# Patient Record
Sex: Female | Born: 1970 | Race: White | Hispanic: No | Smoking: Current every day smoker
Health system: Southern US, Community
[De-identification: ages and names within clinical notes are randomized; demographics above are authoritative.]

## PROBLEM LIST (undated history)

## (undated) DIAGNOSIS — K2210 Ulcer of esophagus without bleeding: Secondary | ICD-10-CM

## (undated) DIAGNOSIS — F32A Depression, unspecified: Secondary | ICD-10-CM

## (undated) DIAGNOSIS — F419 Anxiety disorder, unspecified: Secondary | ICD-10-CM

## (undated) DIAGNOSIS — E785 Hyperlipidemia, unspecified: Secondary | ICD-10-CM

## (undated) DIAGNOSIS — J449 Chronic obstructive pulmonary disease, unspecified: Secondary | ICD-10-CM

## (undated) DIAGNOSIS — G709 Myoneural disorder, unspecified: Secondary | ICD-10-CM

## (undated) DIAGNOSIS — K219 Gastro-esophageal reflux disease without esophagitis: Secondary | ICD-10-CM

## (undated) DIAGNOSIS — F329 Major depressive disorder, single episode, unspecified: Secondary | ICD-10-CM

## (undated) HISTORY — DX: Chronic obstructive pulmonary disease, unspecified: J44.9

## (undated) HISTORY — DX: Ulcer of esophagus without bleeding: K22.10

## (undated) HISTORY — PX: OTHER SURGICAL HISTORY: SHX169

## (undated) HISTORY — DX: Hyperlipidemia, unspecified: E78.5

## (undated) HISTORY — DX: Anxiety disorder, unspecified: F41.9

## (undated) HISTORY — DX: Major depressive disorder, single episode, unspecified: F32.9

## (undated) HISTORY — DX: Myoneural disorder, unspecified: G70.9

## (undated) HISTORY — PX: BREAST SURGERY: SHX581

## (undated) HISTORY — DX: Gastro-esophageal reflux disease without esophagitis: K21.9

## (undated) HISTORY — DX: Depression, unspecified: F32.A

---

## 2004-07-14 ENCOUNTER — Emergency Department: Payer: Self-pay | Admitting: Emergency Medicine

## 2004-07-15 ENCOUNTER — Other Ambulatory Visit: Payer: Self-pay

## 2004-07-19 ENCOUNTER — Ambulatory Visit: Payer: Self-pay | Admitting: *Deleted

## 2004-08-11 ENCOUNTER — Other Ambulatory Visit: Payer: Self-pay

## 2004-08-11 ENCOUNTER — Emergency Department: Payer: Self-pay | Admitting: Emergency Medicine

## 2005-05-22 ENCOUNTER — Emergency Department: Payer: Self-pay | Admitting: Emergency Medicine

## 2005-06-16 ENCOUNTER — Emergency Department: Payer: Self-pay | Admitting: Emergency Medicine

## 2005-09-25 HISTORY — PX: BREAST BIOPSY: SHX20

## 2005-11-01 ENCOUNTER — Emergency Department: Payer: Self-pay | Admitting: Emergency Medicine

## 2005-11-01 ENCOUNTER — Other Ambulatory Visit: Payer: Self-pay

## 2006-04-23 ENCOUNTER — Inpatient Hospital Stay: Payer: Self-pay | Admitting: Obstetrics & Gynecology

## 2006-09-28 ENCOUNTER — Ambulatory Visit: Payer: Self-pay

## 2006-10-04 ENCOUNTER — Ambulatory Visit: Payer: Self-pay

## 2007-02-09 ENCOUNTER — Emergency Department: Payer: Self-pay | Admitting: Emergency Medicine

## 2007-04-13 ENCOUNTER — Ambulatory Visit: Payer: Self-pay | Admitting: Neurology

## 2007-07-15 ENCOUNTER — Other Ambulatory Visit: Payer: Self-pay

## 2007-07-15 ENCOUNTER — Emergency Department: Payer: Self-pay | Admitting: Emergency Medicine

## 2007-12-04 ENCOUNTER — Encounter: Payer: Self-pay | Admitting: Family Medicine

## 2007-12-25 ENCOUNTER — Encounter: Payer: Self-pay | Admitting: Family Medicine

## 2008-03-30 ENCOUNTER — Emergency Department: Payer: Self-pay | Admitting: Emergency Medicine

## 2008-03-30 ENCOUNTER — Other Ambulatory Visit: Payer: Self-pay

## 2009-03-05 ENCOUNTER — Ambulatory Visit: Payer: Self-pay | Admitting: Family Medicine

## 2015-09-09 DIAGNOSIS — E785 Hyperlipidemia, unspecified: Secondary | ICD-10-CM | POA: Insufficient documentation

## 2015-09-09 DIAGNOSIS — G8929 Other chronic pain: Secondary | ICD-10-CM | POA: Insufficient documentation

## 2015-09-09 DIAGNOSIS — J449 Chronic obstructive pulmonary disease, unspecified: Secondary | ICD-10-CM | POA: Insufficient documentation

## 2015-09-09 DIAGNOSIS — N6019 Diffuse cystic mastopathy of unspecified breast: Secondary | ICD-10-CM | POA: Insufficient documentation

## 2015-09-09 DIAGNOSIS — M5127 Other intervertebral disc displacement, lumbosacral region: Secondary | ICD-10-CM | POA: Insufficient documentation

## 2015-09-09 DIAGNOSIS — F419 Anxiety disorder, unspecified: Secondary | ICD-10-CM | POA: Insufficient documentation

## 2015-09-09 DIAGNOSIS — G43909 Migraine, unspecified, not intractable, without status migrainosus: Secondary | ICD-10-CM | POA: Insufficient documentation

## 2015-09-09 DIAGNOSIS — Z720 Tobacco use: Secondary | ICD-10-CM | POA: Insufficient documentation

## 2015-09-09 DIAGNOSIS — K219 Gastro-esophageal reflux disease without esophagitis: Secondary | ICD-10-CM | POA: Insufficient documentation

## 2015-09-09 DIAGNOSIS — F32A Depression, unspecified: Secondary | ICD-10-CM | POA: Insufficient documentation

## 2015-09-09 DIAGNOSIS — F3340 Major depressive disorder, recurrent, in remission, unspecified: Secondary | ICD-10-CM | POA: Insufficient documentation

## 2015-09-09 DIAGNOSIS — F411 Generalized anxiety disorder: Secondary | ICD-10-CM | POA: Insufficient documentation

## 2015-09-09 DIAGNOSIS — M25512 Pain in left shoulder: Secondary | ICD-10-CM

## 2016-06-19 ENCOUNTER — Encounter: Payer: Self-pay | Admitting: *Deleted

## 2016-06-19 ENCOUNTER — Encounter: Payer: Self-pay | Admitting: Family Medicine

## 2016-06-19 ENCOUNTER — Ambulatory Visit (INDEPENDENT_AMBULATORY_CARE_PROVIDER_SITE_OTHER): Payer: Medicaid Other | Admitting: Family Medicine

## 2016-06-19 VITALS — BP 128/76 | HR 60 | Temp 98.4°F | Resp 16 | Ht 64.0 in | Wt 141.6 lb

## 2016-06-19 DIAGNOSIS — Z114 Encounter for screening for human immunodeficiency virus [HIV]: Secondary | ICD-10-CM | POA: Diagnosis not present

## 2016-06-19 DIAGNOSIS — E785 Hyperlipidemia, unspecified: Secondary | ICD-10-CM | POA: Diagnosis not present

## 2016-06-19 DIAGNOSIS — Z131 Encounter for screening for diabetes mellitus: Secondary | ICD-10-CM

## 2016-06-19 DIAGNOSIS — M4726 Other spondylosis with radiculopathy, lumbar region: Secondary | ICD-10-CM | POA: Diagnosis not present

## 2016-06-19 DIAGNOSIS — M47812 Spondylosis without myelopathy or radiculopathy, cervical region: Secondary | ICD-10-CM | POA: Insufficient documentation

## 2016-06-19 DIAGNOSIS — F339 Major depressive disorder, recurrent, unspecified: Secondary | ICD-10-CM | POA: Diagnosis not present

## 2016-06-19 DIAGNOSIS — M47816 Spondylosis without myelopathy or radiculopathy, lumbar region: Secondary | ICD-10-CM | POA: Insufficient documentation

## 2016-06-19 DIAGNOSIS — K219 Gastro-esophageal reflux disease without esophagitis: Secondary | ICD-10-CM | POA: Diagnosis not present

## 2016-06-19 DIAGNOSIS — F419 Anxiety disorder, unspecified: Secondary | ICD-10-CM | POA: Diagnosis not present

## 2016-06-19 DIAGNOSIS — Z720 Tobacco use: Secondary | ICD-10-CM

## 2016-06-19 DIAGNOSIS — G8929 Other chronic pain: Secondary | ICD-10-CM | POA: Diagnosis not present

## 2016-06-19 DIAGNOSIS — N951 Menopausal and female climacteric states: Secondary | ICD-10-CM | POA: Diagnosis not present

## 2016-06-19 MED ORDER — RANITIDINE HCL 150 MG PO TABS: 150.0000 mg | ORAL_TABLET | Freq: Two times a day (BID) | ORAL | 5 refills | Status: DC

## 2016-06-19 MED ORDER — METAXALONE 800 MG PO TABS: 400.0000 mg | ORAL_TABLET | Freq: Two times a day (BID) | ORAL | 2 refills | Status: DC | PRN

## 2016-06-19 NOTE — Assessment & Plan Note (Signed)
Stable, chronic problem. Previousy on Zantac 150mg  x 2 BID, prior H Pylori. No known PUD.  Plan: 1. Reorder Zantac 150mg  BID, reduced dose, will follow trial if needs increased then consider double dose vs PPI

## 2016-06-19 NOTE — Assessment & Plan Note (Signed)
Stable irregular menstrual bleeding with regular period q 1 to 3 months over past >6 months. No excessive bleeding or other symptoms of menopause at this time.

## 2016-06-19 NOTE — Assessment & Plan Note (Signed)
Last lipids 12/2014, stable. Previously with brief course of Crestor and Fish Oil, off all meds now.  Plan: 1. Future fasting lipid panel, follow-up, calculate ASCVD risk

## 2016-06-19 NOTE — Assessment & Plan Note (Signed)
Likely underlying cause of chronic neck pain and stiffness, no recent imaging on chart. Previously treated with Neurontin, consider resuming this or alternative pain medications in future. - Referral to [facility] - Reorder Skelaxin - See other details under Chronic Pain

## 2016-06-19 NOTE — Assessment & Plan Note (Signed)
Currently stable, but some recent concerns with mother moving in over past several months. Chronic problem with depression and related anxiety, related to family stress (specifically living with and acting as primary caregiver for mother), previously treated with Paxil and Therapy, however no longer receiving these.  Plan: 1. Not interested in resuming any medical therapy at this time 2. Given self referral info for local therapist for counseling, suspect this additional support person may be most important for patient now, and future can consider resuming medications if needed

## 2016-06-19 NOTE — Assessment & Plan Note (Addendum)
Stable without recent change, chronic bilateral low back pain with R-sciatica, and neck pain, history with multi level spinal DJD C/L spine. No prior surgery. - Previously managed by Pain Management in [place] - Last MRI C spine 2008, other imaging not available in chart, awaiting records - Previously on Vicodin has been out for >6 months, no longer actively taking, most improved on Skelaxin muscle relaxant. Failed Flexeril, Meloxicam, Neurontin. - Checked [place] CSRS and no active rx available or recent history of any controlled subs fills  Plan: 1. Referral to [facility] 2. Discussion that I do not plan to rx chronic long-term opiates for her, and would start with muscle relaxant. Refilled Skelaxin 800mg  - take half to 1 tab BID PRN pain / spasms 3. Adviesd on Tylenol dosing, may take future Naproxen for up to 2 week at a time flares, cautious with GERD / H pylori history, avoid prolonged course 4. Defer imaging today, anticipate to be obtained through Pain Management screening process 5. Follow-up 1-3 months as needed, returns for physical and labs soon - Future follow-up consider med such as Cymbalta for depression and chronic pain

## 2016-06-19 NOTE — Assessment & Plan Note (Signed)
Likely underlying cause of chronic neck pain and stiffness, no recent imaging on chart. Last MRI C spine 2008 with minimal degenerative changes without significant canal or foraminal narrowing. - Referral to [facility] - Reorder Skelaxin - See other details under Chronic Pain

## 2016-06-19 NOTE — Assessment & Plan Note (Signed)
Active smoker, down to 0.5ppd Not ready to quit, offered smoking cessation counseling, will defer at this time.

## 2016-06-19 NOTE — Progress Notes (Signed)
Subjective:    Patient ID: Denise Estrada, female    DOB: Sep 27, 1970, 45 y.o.   MRN: [id number]  Denise Estrada is a 45 y.o. female presenting on 06/19/2016 for Establish Care; Gastroesophageal Reflux (refill zantac); Muscle Pain (neck/back pain); and pre-menopausal  Recently moved from [place] to [place].  HPI  Chronic Pain / Neck and Low Back with multi level DJD include C5-4, L5-S1 and history of nerve entrapment: - Previously followed by Neurology / Pain Management in [facility], states she has never had back or neck surgery, and was advised that "she did not need surgery" in past. Has been treated with variety of medications including neurontin, muscle relaxants, eventually reduced down to vicodin 10/325mg  PRN and Skelaxin only, has been off of opiate medicine for several months >6 now. Seems to be stable, as muscle relaxant was most beneficial. - Describes chronic pain in back as aching and some sharp pains up to 5/10 average, with some intermittent worsening, prolonged activity, sitting, standing, forward flexion. Improved with muscle relaxant. Some mild relief with Tylenol only PRN. Advised not to take ibuprofen due to GERD / H Pylori. No known PUD. No prior physical therapy. - Interested in [facility] referral - Denies any saddle anesthesia, bowel or bladder incontinence  Depression / Anxiety social: - Reports problems with her husband for 9 years and issues during her childhood with her mother (she has been a primary care giver for her up to 17 yrs), she has moved from [place] to [place] over past 6 months to avoid her mother, family contacted now over past several weeks her mother is now again living with her. Admits significant stress between her and husband due to mother. - Continues to care for her mother, and help with medications - Admits worsening symptoms of depression and anxiety with stress caused by her mother, who often "seeks attention" or is "child-like" and  causes problems between her and her husband. - Initially had participated in therapy (in [facility] [place]) for up to 1 yr with improvement, then no longer followed - Treated with Paxil for period of time, with improvement, then gradually weaned off. Thinks she took Buspar in past. - Additional stressor, Father passed away 2 years ago, due to lung cancer - Interested in therapy, not interested in the treatment - Denies any concerns for her safety at home, suicidal or homicidal ideation  GERD: - Chronic problem, improved on Zantac high dose 150mg  x 2 tabs BID, request refill today. Also dx with H Pylori in past, treated and resolved. No other known complications. No known PUD.  HYPERLIPIDEMIA: - Reports no concerns. Last lipid panel 12/2014 - Previously took Crestor 20mg  (for 3 months) and Fish Oil (5-6 years) with good results, without any myalgias - Regular exercise with walking and playing with children / grandchildren, limited by low back pain - No history of abnormal - mother father and fathers moth er - Brother and mother  Pre-Menopausal Menstrual Irregularity - Admits last regular period in 09/2015, then had irregular menstrual cycle for up to 2-3 months. Denies heavy menstrual bleeding or other menopausal symptoms.  Health Maintenance: - History of abnormal pap smear 1997 (abnormal cells, s/p cervical biopsy, removing large portion), again around 2010/2011 had abnormal cells and dx with HPV. Last pap smear 2016, normal and negative HPV. Next due 2021. - Last mammo 07/2015, due for next 07/2016, prior breast biopsy 2010 (negative). Maternal grandmother with breast cancer age >40-60 -  Declines Flu shot today, counseled on risks and benefits - Last TDap admits to within past 1-3 years, will await records   Past Medical History:  Diagnosis Date  . Depression   . GERD (gastroesophageal reflux disease)   . Hyperlipidemia   . Ulcer of esophagus    Social History   Social History    . Marital status: Married    Spouse name: N/A  . Number of children: N/A  . Years of education: N/A   Occupational History  . Not on file.   Social History Main Topics  . Smoking status: Current Every Day Smoker    Packs/day: 0.50    Years: 20.00    Types: Cigarettes  . Smokeless tobacco: Never Used  . Alcohol use No  . Drug use: No  . Sexual activity: Yes    Birth control/ protection: None   Other Topics Concern  . Not on file   Social History Narrative  . No narrative on file   Family History  Problem Relation Age of Onset  . Heart disease Mother   . Stroke Mother   . Diabetes Mother   . Depression Mother   . Cancer Father   . Heart disease Father   . Stroke Father   . Diabetes Father   . Heart attack Brother   . Diabetes Brother   . Hypertension Brother   . Diabetes Son     type 1  . Cancer Maternal Grandmother   . Hypertension Maternal Grandmother   . Stroke Maternal Grandfather   . Heart attack Maternal Grandfather   . Diabetes Maternal Grandfather   . Diabetes Paternal Grandmother    No current outpatient prescriptions on file prior to visit.   No current facility-administered medications on file prior to visit.     Review of Systems  Constitutional: Negative for activity change, appetite change, chills, diaphoresis, fatigue, fever and unexpected weight change.  HENT: Negative for congestion, hearing loss, postnasal drip, sinus pressure, trouble swallowing and voice change.   Eyes: Negative for visual disturbance.  Respiratory: Negative for cough, chest tightness, shortness of breath and wheezing.   Cardiovascular: Negative for chest pain, palpitations and leg swelling.  Gastrointestinal: Negative for abdominal pain, blood in stool, constipation, diarrhea, nausea and vomiting.  Endocrine: Negative for cold intolerance, polyphagia and polyuria.  Genitourinary: Positive for menstrual problem (irregular menstrual cycles, pre-menopausal). Negative for  dysuria, frequency and hematuria.  Musculoskeletal: Positive for back pain (low back bilateral, lumbosacral region) and neck stiffness (chronic, limited extension). Negative for arthralgias, gait problem, joint swelling and neck pain.  Skin: Negative for rash.  Allergic/Immunologic: Negative for environmental allergies.  Neurological: Negative for dizziness, weakness, light-headedness, numbness and headaches.  Hematological: Negative for adenopathy.  Psychiatric/Behavioral: Positive for dysphoric mood (prior history of, improved currently). Negative for agitation, behavioral problems, confusion, decreased concentration, hallucinations, self-injury, sleep disturbance and suicidal ideas. The patient is nervous/anxious. The patient is not hyperactive.    Per HPI unless specifically indicated above   Depression screen [facility] 2/9 06/19/2016  Decreased Interest 2  Down, Depressed, Hopeless 2  PHQ - 2 Score 4  Altered sleeping 0  Tired, decreased energy 2  Change in appetite 0  Feeling bad or failure about yourself  0  Trouble concentrating 0  Moving slowly or fidgety/restless 0  Suicidal thoughts 0  PHQ-9 Score 6  Difficult doing work/chores Somewhat difficult        Objective:    BP 128/76 (BP Location: Right Arm, Patient  Position: Sitting, Cuff Size: Normal)   Pulse 60   Temp 98.4 F (36.9 C) (Oral)   Resp 16   Ht 5\' 4"  (1.626 m)   Wt 141 lb 9.6 oz (64.2 kg)   BMI 24.31 kg/m   Wt Readings from Last 3 Encounters:  06/19/16 141 lb 9.6 oz (64.2 kg)    Physical Exam  Constitutional: She is oriented to person, place, and time. She appears well-developed and well-nourished. No distress.  Well-appearing, comfortable, cooperative  HENT:  Head: Normocephalic and atraumatic.  Mouth/Throat: Oropharynx is clear and moist.  Eyes: Conjunctivae and EOM are normal. Pupils are equal, round, and reactive to light.  Neck: No thyromegaly present.  Neck Inspection: Normal appearing, some slight  loss of c-spine lordosis Palpation: non-tender to palpation posterior cervical spinous process or paraspinal muscles ROM: limited neck extension due to chronic stiffness and pain, intact forward flexion, and rotation R/L Special Testing: Spurling's Manuever negative for radicular symptoms into arms, some pulling and tightness with pain on Left into shoulder only Strength: Grip 5/5 bilaterally, initially not full effort on Left but improved on repeat, shoulders 5/5 Neurovascular: distal sensation to light touch intact   Cardiovascular: Normal rate, regular rhythm, normal heart sounds and intact distal pulses.   No murmur heard. Pulmonary/Chest: Effort normal and breath sounds normal. No respiratory distress. She has no wheezes. She has no rales.  Abdominal: Soft. Bowel sounds are normal. She exhibits no distension and no mass. There is no tenderness.  Musculoskeletal: Normal range of motion. She exhibits no edema or tenderness.  Low Back Inspection: Normal appearance, no spinal deformity, symmetrical. Palpation: No tenderness over spinous processes. Lower lumbosacral Bilateral paraspinal muscles mild tender with some appreciable hypertonicity and spasm, R>L ROM: Limited forward flexion active ROM due to pain and tightness in back and legs, mostly full back extension, rotation L/R without discomfort Special Testing: Seated SLR positive for radiculopathy Right. Left with tightness but no radicular pain. Strength: Bilateral hip flex/ext 5/5, knee flex/ext 5/5, ankle dorsiflex/plantarflex 5/5 Neurovascular: intact distal sensation to light touch  Lymphadenopathy:    She has no cervical adenopathy.  Neurological: She is alert and oriented to person, place, and time.  Skin: Skin is warm and dry. No rash noted. She is not diaphoretic.  Psychiatric: She has a normal mood and affect. Her behavior is normal. Thought content normal.  Well groomed, good eye contact, normal thought and speech.  Nursing  note and vitals reviewed.  No results found for this or any previous visit.    Assessment & Plan:   Problem List Items Addressed This Visit    Tobacco abuse    Active smoker, down to 0.5ppd Not ready to quit, offered smoking cessation counseling, will defer at this time.      Recurrent depression (HCC)    Currently stable, but some recent concerns with mother moving in over past several months. Chronic problem with depression and related anxiety, related to family stress (specifically living with and acting as primary caregiver for mother), previously treated with Paxil and Therapy, however no longer receiving these.  Plan: 1. Not interested in resuming any medical therapy at this time 2. Given self referral info for local therapist for counseling, suspect this additional support person may be most important for patient now, and future can consider resuming medications if needed      Relevant Orders   COMPLETE METABOLIC PANEL WITH GFR   Perimenopausal    Stable irregular menstrual bleeding with regular period  q 1 to 3 months over past >6 months. No excessive bleeding or other symptoms of menopause at this time.      Hyperlipidemia    Last lipids 12/2014, stable. Previously with brief course of Crestor and Fish Oil, off all meds now.  Plan: 1. Future fasting lipid panel, follow-up, calculate ASCVD risk      Relevant Orders   COMPLETE METABOLIC PANEL WITH GFR   Lipid panel   Hemoglobin A1c   GERD (gastroesophageal reflux disease)    Stable, chronic problem. Previousy on Zantac 150mg  x 2 BID, prior H Pylori. No known PUD.  Plan: 1. Reorder Zantac 150mg  BID, reduced dose, will follow trial if needs increased then consider double dose vs PPI      Relevant Medications   ranitidine (ZANTAC) 150 MG tablet   DJD (degenerative joint disease) of cervical spine    Likely underlying cause of chronic neck pain and stiffness, no recent imaging on chart. Last MRI C spine 2008 with  minimal degenerative changes without significant canal or foraminal narrowing. - Referral to [facility] - Reorder Skelaxin - See other details under Chronic Pain      Relevant Medications   metaxalone (SKELAXIN) 800 MG tablet   Other Relevant Orders   Ambulatory referral to Pain Clinic   Degenerative joint disease (DJD) of lumbar spine    Likely underlying cause of chronic neck pain and stiffness, no recent imaging on chart. Previously treated with Neurontin, consider resuming this or alternative pain medications in future. - Referral to [facility] - Reorder Skelaxin - See other details under Chronic Pain      Relevant Medications   metaxalone (SKELAXIN) 800 MG tablet   Other Relevant Orders   Ambulatory referral to Pain Clinic   Chronic pain - Primary    Stable without recent change, chronic bilateral low back pain with R-sciatica, and neck pain, history with multi level spinal DJD C/L spine. No prior surgery. - Previously managed by Pain Management in [place] - Last MRI C spine 2008, other imaging not available in chart, awaiting records - Previously on Vicodin has been out for >6 months, no longer actively taking, most improved on Skelaxin muscle relaxant. Failed Flexeril, Meloxicam, Neurontin. - Checked [place] CSRS and no active rx available or recent history of any controlled subs fills  Plan: 1. Referral to [facility] Management 2. Discussion that I do not plan to rx chronic long-term opiates for her, and would start with muscle relaxant. Refilled Skelaxin 800mg  - take half to 1 tab BID PRN pain / spasms 3. Adviesd on Tylenol dosing, may take future Naproxen for up to 2 week at a time flares, cautious with GERD / H pylori history, avoid prolonged course 4. Defer imaging today, anticipate to be obtained through Pain Management screening process 5. Follow-up 1-3 months as needed, returns for physical and labs soon - Future follow-up consider med such as  Cymbalta for depression and chronic pain      Relevant Medications   metaxalone (SKELAXIN) 800 MG tablet   Other Relevant Orders   Ambulatory referral to Pain Clinic   Anxiety    Chronic problem, related to depression. Concern with social anxiety. Prior treatment only with Paxil with some improvement, not longer-term therapy. Also thought to take Buspar PRN. No longer treated. - Follow-up self referral therapy recommendations - Future follow-up, consider cymbalta for chronic pain as well. Or alternative medication       Other Visit Diagnoses  Screening for HIV (human immunodeficiency virus)       Relevant Orders   HIV antibody   Screening for diabetes mellitus       Relevant Orders   Hemoglobin A1c      Meds ordered this encounter  Medications  . DISCONTD: ranitidine (ZANTAC) 150 MG tablet    Sig: Take 150 mg by mouth 2 (two) times daily.  . metaxalone (SKELAXIN) 800 MG tablet    Sig: Take 0.5-1 tablets (400-800 mg total) by mouth 2 (two) times daily as needed for muscle spasms.    Dispense:  60 tablet    Refill:  2  . ranitidine (ZANTAC) 150 MG tablet    Sig: Take 1 tablet (150 mg total) by mouth 2 (two) times daily.    Dispense:  60 tablet    Refill:  5      Follow up plan: Return in about 1 month (around 07/19/2016) for annual physical, f/u labs.  Saralyn Pilar, DO [facility] 06/19/2016, 2:36 PM

## 2016-06-19 NOTE — Patient Instructions (Addendum)
Thank you for coming in to clinic today.  1. For Pain, ordered Skelaxin, as takes prescribed when needed  If you had a significant flare up of pain, you may take Naproxen for short course max 2 weeks Recommend trial of Anti-inflammatory with Naproxen (Naprosyn) 500mg  tabs - take one with food and plenty of water TWICE daily every day (breakfast and dinner), for next 2 weeks, then you may take only as needed - DO NOT TAKE any ibuprofen, aleve, motrin while you are taking this medicine  - It is safe to take Tylenol Ext Str 500mg  tabs - take 1 to 2 (max dose 1000mg ) every 6 hours as needed for breakthrough pain, max 24 hour daily dose is 6 to 8 tablets or 4000mg   2. - Please schedule a "Lab Only" visit (early morning, 8:00am to 9:00am) to get your blood work drawn here at our clinic. - You need to be fasting (No food or drink after midnight, and nothing in the morning before your blood draw). - I have already ordered your blood work, so you may schedule this appointment at your convenience - We can discuss results at next appointment, otherwise our office will contact you with results by phone or with a letter  Psych Counseling / Therapy Recoomendations  Self Referral:  1. [facility]   Address: [address], [place], [place] [address] Hours: Open today  9AM-7PM Phone: [phone]  2. [facility] [address]  [facility] Address: [address], [place], [place] [address] Phone: [phone]  Lowered Zantac to 150mg  1 tab twice a day for now, in future we can increase if needed  Referral to [facility], stay tuned it may take a few weeks to get established.  Remember Mamogram  Future if you want flu shot let us know  Please schedule a follow-up appointment with Dr. Althea CharonKaramalegos in 1 to 3 months for annual physical, review lab results  If you have any other questions or concerns, please feel free to call the clinic or send a  message through MyChart. You may also schedule an earlier appointment if necessary.  Saralyn PilarAlexander Koki Buxton, DO [facility]

## 2016-06-19 NOTE — Assessment & Plan Note (Signed)
Chronic problem, related to depression. Concern with social anxiety. Prior treatment only with Paxil with some improvement, not longer-term therapy. Also thought to take Buspar PRN. No longer treated. - Follow-up self referral therapy recommendations - Future follow-up, consider cymbalta for chronic pain as well. Or alternative medication

## 2016-07-14 ENCOUNTER — Other Ambulatory Visit: Payer: Self-pay | Admitting: *Deleted

## 2016-07-14 DIAGNOSIS — Z131 Encounter for screening for diabetes mellitus: Secondary | ICD-10-CM

## 2016-07-14 DIAGNOSIS — F339 Major depressive disorder, recurrent, unspecified: Secondary | ICD-10-CM

## 2016-07-14 DIAGNOSIS — E785 Hyperlipidemia, unspecified: Secondary | ICD-10-CM

## 2016-07-14 DIAGNOSIS — Z114 Encounter for screening for human immunodeficiency virus [HIV]: Secondary | ICD-10-CM

## 2016-07-17 ENCOUNTER — Other Ambulatory Visit: Payer: Medicaid Other

## 2016-07-18 LAB — LIPID PANEL
CHOL/HDL RATIO: 3.8 ratio (ref <=5.0)
Cholesterol: 174 mg/dL (ref 125–200)
HDL: 46 mg/dL (ref >=46)
LDL CALC: 109 mg/dL (ref <130)
TRIGLYCERIDES: 94 mg/dL (ref <150)
VLDL: 19 mg/dL (ref <30)

## 2016-07-18 LAB — COMPLETE METABOLIC PANEL WITH GFR
ALT: 26 U/L (ref 6–29)
AST: 25 U/L (ref 10–35)
Albumin: 4.4 g/dL (ref 3.6–5.1)
Alkaline Phosphatase: 67 U/L (ref 33–115)
BUN: 8 mg/dL (ref 7–25)
CHLORIDE: 105 mmol/L (ref 98–110)
CO2: 27 mmol/L (ref 20–31)
Calcium: 9.5 mg/dL (ref 8.6–10.2)
Creat: 0.73 mg/dL (ref 0.50–1.10)
GLUCOSE: 98 mg/dL (ref 65–99)
POTASSIUM: 4.6 mmol/L (ref 3.5–5.3)
SODIUM: 141 mmol/L (ref 135–146)
Total Bilirubin: 0.5 mg/dL (ref 0.2–1.2)
Total Protein: 7.1 g/dL (ref 6.1–8.1)

## 2016-07-18 LAB — HEMOGLOBIN A1C
Hgb A1c MFr Bld: 5.5 % (ref <5.7)
Mean Plasma Glucose: 111 mg/dL

## 2016-07-18 LAB — HIV ANTIBODY (ROUTINE TESTING W REFLEX): HIV 1&2 Ab, 4th Generation: NONREACTIVE

## 2016-07-19 ENCOUNTER — Ambulatory Visit (INDEPENDENT_AMBULATORY_CARE_PROVIDER_SITE_OTHER): Payer: Medicaid Other | Admitting: Family Medicine

## 2016-07-19 ENCOUNTER — Encounter: Payer: Self-pay | Admitting: Family Medicine

## 2016-07-19 ENCOUNTER — Other Ambulatory Visit: Payer: Self-pay | Admitting: Family Medicine

## 2016-07-19 ENCOUNTER — Telehealth: Payer: Self-pay | Admitting: Family Medicine

## 2016-07-19 VITALS — BP 121/76 | HR 68 | Temp 98.0°F | Resp 16 | Ht 64.0 in | Wt 147.0 lb

## 2016-07-19 DIAGNOSIS — Z720 Tobacco use: Secondary | ICD-10-CM | POA: Diagnosis not present

## 2016-07-19 DIAGNOSIS — E782 Mixed hyperlipidemia: Secondary | ICD-10-CM | POA: Diagnosis not present

## 2016-07-19 DIAGNOSIS — Z0000 Encounter for general adult medical examination without abnormal findings: Secondary | ICD-10-CM | POA: Diagnosis not present

## 2016-07-19 DIAGNOSIS — F339 Major depressive disorder, recurrent, unspecified: Secondary | ICD-10-CM | POA: Diagnosis not present

## 2016-07-19 DIAGNOSIS — M4726 Other spondylosis with radiculopathy, lumbar region: Secondary | ICD-10-CM | POA: Diagnosis not present

## 2016-07-19 DIAGNOSIS — F419 Anxiety disorder, unspecified: Secondary | ICD-10-CM

## 2016-07-19 DIAGNOSIS — G8929 Other chronic pain: Secondary | ICD-10-CM | POA: Diagnosis not present

## 2016-07-19 DIAGNOSIS — J432 Centrilobular emphysema: Secondary | ICD-10-CM

## 2016-07-19 DIAGNOSIS — Z1239 Encounter for other screening for malignant neoplasm of breast: Secondary | ICD-10-CM

## 2016-07-19 MED ORDER — PAROXETINE HCL 10 MG PO TABS: 10.0000 mg | ORAL_TABLET | Freq: Every day | ORAL | 1 refills | Status: DC

## 2016-07-19 NOTE — Assessment & Plan Note (Addendum)
Controlled, diet/lifestyle. Last fasting lipids 06/2016 without abnormality No indication for statin currently, ASCVD 5.5% (reviewed with patient today), if non smoker then down to 2.2%, in future can consider resume Crestor if needed Follow A1c 5.5%, not pre-DM

## 2016-07-19 NOTE — Patient Instructions (Signed)
Thank you for coming in to clinic today.  1. Take Paxil half tab for 1 week, then can increase to 1 whole tab as tolerated, stay at 10mg  1 tab daily for 3 weeks until I see you back, unless you feel ready to increase to 20mg  daily, you can notify us by phone or mychart message  Self Referral - Therapy Counseling [facility](Behavioral Health) [place] [address], [place], [place] [address] Phone: [phone]  [facility], available walk-in 9am-4pm M-F [address] [place], [place] [address] Hours: 9am - 4pm (M-F, walk in available) [phone]  [facility]   Address: [address], [place], [place] [address] Hours: 8AM-5PM (accepts walk in to establish) Phone: [phone]) [phone]  [facility]   Address: [address], [place], [place] [address] Hours: Open today  9AM-7PM Phone: [phone]  Anell Barrheryl [facility]  - [facility] Address: [address], [place], [place] [address] Phone: [phone]  [facility]   Address: [address], [place], [place] [address] Hours: Tues 4:15PM-8PM // Weds 9am - 12pm // Magdalene Mollyhurs 1pm - 8pm (Closed other days) Phone: [phone]   2. [facility] Pain medicine referral is still in process, stay tuned on this. It may take several more weeks. - As discussed, I can offer trial on Gabapentin again in future if you would like to try this   3. Labs are all good. No changes today - In future we can consider low dose 81 mg Enteric Coated aspirin for reducing heart attack or stroke risk, but I do not think you need this today, maybe age 85>50 - No cholesterol medication at this time  Work on cutting back and quitting smoking still, this will significant reduce risk of future problem of heart attack / stroke  1 [phone]-[phone] NOW  Please schedule a follow-up appointment with Dr. Althea CharonKaramalegos in 4 weeks for  Depression follow-up  Get mammogram scheduled in 07/2016  Next physical 1 year, we can check Diabetes screening A1c at that time as well, unless you are concerned about weight gain and want to check this in 6 months. Same as this time, notify us 1-2 weeks before your annual physical to get all the blood work ordered.  If you have any other questions or concerns, please feel free to call the clinic or send a message through MyChart. You may also schedule an earlier appointment if necessary.  Saralyn PilarAlexander Karamalegos, DO [facility]

## 2016-07-19 NOTE — Assessment & Plan Note (Signed)
Acute worsening depression with life stressors at home, related to living with mother, causing tension with her husband, concerns with verbal / emotional conflict. Denies any physical abuse or harm. -PHQ9: from 6 to 22 now, significant difficulty functioning  Plan: 1. Resume Paxil therapy, started low dose 10mg  daily, may cut in half for 1 week (similar prior treatment last episode of depression years ago, tolerated low dose better) 2. Given self referral info for local therapist for counseling 3. Follow-up 4 weeks, adjust medication, if not effective consider switch to Duloxetine for chronic pain as well

## 2016-07-19 NOTE — Telephone Encounter (Signed)
Order

## 2016-07-19 NOTE — Assessment & Plan Note (Signed)
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 Improved on Robaxin. No longer on opiates >6 months. - Pending scheduled apt from [facility], checked today it is still in process

## 2016-07-19 NOTE — Assessment & Plan Note (Signed)
Stable without worsening. Not requiring any maintenance therapy Chronic history of tobacco abuse Follow-up, consider Albuterol PRN vs maintenance if worsening

## 2016-07-19 NOTE — Assessment & Plan Note (Signed)
Active smoker, continues to wean down on E-cigs Not ready to quit, recent inc stress Smoking cessation counseling today, follow-up

## 2016-07-19 NOTE — Assessment & Plan Note (Signed)
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 Improved on Robaxin. No longer on opiates >6 months. - Titrate Tylenol, maximize conservative therapy - Pending scheduled apt from last [facility], checked today it is still in process - Future consider Cymbalta instead of Paxil if needed for both pain and mood

## 2016-07-19 NOTE — Assessment & Plan Note (Signed)
Chronic recent worsening with stress and depression. Treat both with Paxil, offered therapy / counseling F/u 4 weeks

## 2016-07-19 NOTE — Progress Notes (Signed)
Subjective:    Patient ID: Denise Estrada, female    DOB: [birth year], [age] y.o.   MRN: [id number]  Denise Estrada is a 45 y.o. female presenting on 07/19/2016 for Annual Exam (pt had papsmear 2016 wnl)  HPI  Depression / Anxiety: - Last visit 06/19/16 reviewed this problem when established care, however at that time she was coping with symptoms and did not want to resume medical treatment. - Today she describes worsening depression secondary to stress and anxiety, over past 1-2 weeks, same stressors as before with living at home with mother causing increased tension and stress, also with her husband, had increased stress with her birthday last week describes arguments with verbal and emotional - She lives near her son, she states that as soon as she gets up she will take coffee over to her son's house and spend as much time as she can away from home to avoid the stress, both her husband and her mother can worsen her anxiety - Previously on Paxil 5-10mg  for about 1 yr or less with good results, now interested in resuming this medication, also no longer receiving therapy and counseling ([facility]) she was discharged due to resolved symptoms - Denies any suicidal ideation or thoughts of harming others - Denies any concern for safety or physical abuse  Chronic Pain / Neck and Low Back with multi level 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 No prior spine surgery or other problems. - Today reports persistent chronic back and neck pain, recent flare up to 7/10, but on avg 5/10, intermittent worsening with radicular pain sciatica down Right leg, sometimes both. Recently has had worse Left leg symptoms. - Taking skelaxin BID with good relief - Prior history offered gabapentin, but not  interested in side effects, did not take vs did not continue long - Taking some Tylenol - Limited exercise due to back pain - Denies any saddle anesthesia, bowel or bladder incontinence  HYPERLIPIDEMIA: - Last fasting lipid panel 07/17/16, reviewed results today with well controlled cholesterol. No longer on statin, previously on Crestor 20mg  x 3 months and Fish Oil with good results. - Not taking ASA daily, due to history of GERD  TOBACCO ABUSE / H/o COPD - Active smoker, using e cigs, cut down to < 0.5pp, not ready to quit, due to life stressors - No problems with COPD, not on any inhalers has never had significant flare up or hospitalization  Health Maintenance: - History of abnormal pap smear 1997 (abnormal cells, s/p cervical biopsy, removing large portion), again around 2010/2011 had abnormal cells and dx with HPV. Last pap smear 2016, normal and negative HPV. Next due 2021. - Last mammo 07/2015, due for next 07/2016, prior breast biopsy 2010 (negative). Maternal grandmother with breast cancer age 80>55-60  - Declines Flu shot, offered again, counseled on risks and benefits  Still awaiting outside records [facility]  Past Medical History:  Diagnosis Date  . Depression   . GERD (gastroesophageal reflux disease)   . Hyperlipidemia   . Ulcer of esophagus    Social History   Social History  . Marital status: Married    Spouse name: N/A  . Number of children: N/A  . Years of education: N/A   Occupational History  .  Not on file.   Social History Main Topics  . Smoking status: Current Every Day Smoker    Packs/day: 0.50    Years: 20.00    Types: Cigarettes  . Smokeless tobacco: Current User  . Alcohol use No  . Drug use: No  . Sexual activity: Yes    Birth control/ protection: None   Other Topics Concern  . Not on file   Social History Narrative  . No narrative on file   Family History  Problem Relation Age of Onset  . Heart disease Mother   .  Stroke Mother   . Diabetes Mother   . Depression Mother   . Cancer Father   . Heart disease Father   . Stroke Father   . Diabetes Father   . Heart attack Brother   . Diabetes Brother   . Hypertension Brother   . Diabetes Son     type 1  . Cancer Maternal Grandmother   . Hypertension Maternal Grandmother   . Stroke Maternal Grandfather   . Heart attack Maternal Grandfather   . Diabetes Maternal Grandfather   . Diabetes Paternal Grandmother    Current Outpatient Prescriptions on File Prior to Visit  Medication Sig  . metaxalone (SKELAXIN) 800 MG tablet Take 0.5-1 tablets (400-800 mg total) by mouth 2 (two) times daily as needed for muscle spasms.  . ranitidine (ZANTAC) 150 MG tablet Take 1 tablet (150 mg total) by mouth 2 (two) times daily.   No current facility-administered medications on file prior to visit.     Review of Systems  Constitutional: Positive for fatigue. Negative for activity change, appetite change, chills, diaphoresis, fever and unexpected weight change.  HENT: Negative for congestion, hearing loss and sinus pressure.   Eyes: Negative for visual disturbance.  Respiratory: Negative for cough, chest tightness, shortness of breath and wheezing.   Cardiovascular: Negative for chest pain, palpitations and leg swelling.  Gastrointestinal: Negative for abdominal pain, anal bleeding, blood in stool, constipation, diarrhea, nausea and vomiting.  Endocrine: Negative for cold intolerance, polydipsia and polyuria.  Genitourinary: Negative for dysuria, frequency and hematuria.  Musculoskeletal: Positive for arthralgias and back pain. Negative for neck pain.  Skin: Negative for rash.  Allergic/Immunologic: Negative for environmental allergies.  Neurological: Negative for dizziness, weakness, light-headedness, numbness and headaches.  Hematological: Negative for adenopathy.  Psychiatric/Behavioral: Positive for decreased concentration, dysphoric mood and sleep disturbance.  Negative for agitation, behavioral problems, hallucinations, self-injury and suicidal ideas. The patient is nervous/anxious. The patient is not hyperactive.    Per HPI unless specifically indicated above   Depression screen [facility] 2/9 07/19/2016 06/19/2016  Decreased Interest 3 2  Down, Depressed, Hopeless 3 2  PHQ - 2 Score 6 4  Altered sleeping 3 0  Tired, decreased energy 3 2  Change in appetite 2 0  Feeling bad or failure about yourself  3 0  Trouble concentrating 2 0  Moving slowly or fidgety/restless 3 0  Suicidal thoughts 0 0  PHQ-9 Score 22 6  Difficult doing work/chores Very difficult Somewhat difficult        Objective:    BP 121/76   Pulse 68   Temp 98 F (36.7 C) (Oral)   Resp 16   Ht 5\' 4"  (1.626 m)   Wt 147 lb (66.7 kg)   LMP 04/25/2016 Comment: postmanoposal  BMI 25.23 kg/m   Wt Readings from Last 3 Encounters:  07/19/16 147 lb (66.7 kg)  06/19/16 141 lb 9.6 oz (64.2 kg)  Physical Exam  Constitutional: She is oriented to person, place, and time. She appears well-developed and well-nourished. No distress.  Well-appearing, comfortable, cooperative  HENT:  Head: Normocephalic and atraumatic.  Mouth/Throat: Oropharynx is clear and moist.  Eyes: Conjunctivae and EOM are normal. Pupils are equal, round, and reactive to light.  Neck: No thyromegaly present.  Neck Inspection: Normal appearing, some slight loss of c-spine lordosis Palpation: non-tender to palpation posterior cervical spinous process or paraspinal muscles ROM: limited neck extension due to chronic stiffness and pain, intact forward flexion, and rotation R/L Special Testing: Spurling's Manuever negative for radicular symptoms into arms, some pulling and tightness with pain on Left into shoulder only Strength: Grip 5/5 bilaterally, initially not full effort on Left but improved on repeat, shoulders 5/5 Neurovascular: distal sensation to light touch intact   Cardiovascular: Normal rate, regular  rhythm, normal heart sounds and intact distal pulses.   No murmur heard. Pulmonary/Chest: Effort normal and breath sounds normal. No respiratory distress. She has no wheezes. She has no rales.  Abdominal: Soft. Bowel sounds are normal. She exhibits no distension and no mass. There is no tenderness.  Musculoskeletal: Normal range of motion. She exhibits no edema or tenderness.  Low Back Inspection: Normal appearance, no spinal deformity, symmetrical. Palpation: No tenderness over spinous processes. Lower lumbosacral Bilateral paraspinal muscles mild tender with some appreciable hypertonicity and spasm ROM: Limited forward flexion active ROM due to pain and tightness in back and legs, mostly full back extension, rotation L/R without discomfort Special Testing: Seated SLR positive for radiculopathy Left, previously had more Right sided symptoms Strength: Bilateral hip flex/ext 5/5, knee flex/ext 5/5, ankle dorsiflex/plantarflex 5/5 Neurovascular: intact distal sensation to light touch  Lymphadenopathy:    She has no cervical adenopathy.  Neurological: She is alert and oriented to person, place, and time.  Skin: Skin is warm and dry. No rash noted. She is not diaphoretic.  Psychiatric: Her behavior is normal. Thought content normal.  Well groomed, good eye contact, normal thought and speech, depressed mood but hopeful and interested in treatment. No crying spell or labile mood.  Nursing note and vitals reviewed.    I have personally reviewed the following lab results from 07/14/16.  Results for orders placed or performed in visit on 07/14/16  COMPLETE METABOLIC PANEL WITH GFR  Result Value Ref Range   Sodium 141 135 - 146 mmol/L   Potassium 4.6 3.5 - 5.3 mmol/L   Chloride 105 98 - 110 mmol/L   CO2 27 20 - 31 mmol/L   Glucose, Bld 98 65 - 99 mg/dL   BUN 8 7 - 25 mg/dL   Creat 1.61 0.96 - 0.45 mg/dL   Total Bilirubin 0.5 0.2 - 1.2 mg/dL   Alkaline Phosphatase 67 33 - 115 U/L   AST 25 10  - 35 U/L   ALT 26 6 - 29 U/L   Total Protein 7.1 6.1 - 8.1 g/dL   Albumin 4.4 3.6 - 5.1 g/dL   Calcium 9.5 8.6 - 40.9 mg/dL   GFR, Est African American >89 >=60 mL/min   GFR, Est Non African American >89 >=60 mL/min  Lipid panel  Result Value Ref Range   Cholesterol 174 125 - 200 mg/dL   Triglycerides 94 <811 mg/dL   HDL 46 >=91 mg/dL   Total CHOL/HDL Ratio 3.8 <=5.0 Ratio   VLDL 19 <30 mg/dL   LDL Cholesterol 478 <295 mg/dL  HIV antibody  Result Value Ref Range   HIV 1&2 Ab, 4th  Generation NONREACTIVE NONREACTIVE  Hemoglobin A1c  Result Value Ref Range   Hgb A1c MFr Bld 5.5 <5.7 %   Mean Plasma Glucose 111 mg/dL      Assessment & Plan:   Problem List Items Addressed This Visit    Tobacco abuse    Active smoker, continues to wean down on E-cigs Not ready to quit, recent inc stress Smoking cessation counseling today, follow-up      Recurrent depression (HCC)    Acute worsening depression with life stressors at home, related to living with mother, causing tension with her husband, concerns with verbal / emotional conflict. Denies any physical abuse or harm. -PHQ9: from 6 to 22 now, significant difficulty functioning  Plan: 1. Resume Paxil therapy, started low dose 10mg  daily, may cut in half for 1 week (similar prior treatment last episode of depression years ago, tolerated low dose better) 2. Given self referral info for local therapist for counseling 3. Follow-up 4 weeks, adjust medication, if not effective consider switch to Duloxetine for chronic pain as well      Relevant Medications   PARoxetine (PAXIL) 10 MG tablet   Hyperlipidemia    Controlled, diet/lifestyle. 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 Improved on Robaxin. No longer on opiates >6 months. - Pending scheduled apt from last referral to [facility] Pain management, checked today it is still in process      COPD (chronic obstructive pulmonary disease) (HCC)    Stable without worsening. Not requiring any maintenance therapy Chronic history of tobacco abuse Follow-up, consider Albuterol PRN vs maintenance if worsening      Chronic pain    Stable, chronic underlying pain etiology with bilateral sciatica. Improved on Robaxin. No longer on opiates >6 months. - Titrate Tylenol, maximize conservative therapy - Pending scheduled apt from last referral to [facility] management, checked today it is still in process - Future consider Cymbalta instead of Paxil if needed for both pain and mood      Relevant Medications   PARoxetine (PAXIL) 10 MG tablet   Anxiety    Chronic recent worsening with stress and depression. Treat both with Paxil, offered therapy / counseling F/u 4 weeks      Relevant Medications   PARoxetine (PAXIL) 10 MG tablet    Other Visit Diagnoses    Annual physical exam    -  Primary      Meds ordered this encounter  Medications  . PARoxetine (PAXIL) 10 MG tablet    Sig: Take 1 tablet (10 mg total) by mouth daily.    Dispense:  30 tablet    Refill:  1      Follow up plan: Return in about 4 weeks (around 08/16/2016) for Depression.  Saralyn Pilar, DO [facility] 07/19/2016, 12:54 PM

## 2016-07-19 NOTE — Telephone Encounter (Signed)
Pt called [place] to schedule mammo but they told her she needed an order because she has medicaid.  Her call back number is [phone]

## 2016-08-16 ENCOUNTER — Ambulatory Visit: Payer: Medicaid Other | Admitting: Family Medicine

## 2016-08-23 ENCOUNTER — Encounter: Payer: Self-pay | Admitting: Radiology

## 2016-08-23 ENCOUNTER — Ambulatory Visit
Admission: RE | Admit: 2016-08-23 | Discharge: 2016-08-23 | Disposition: A | Discharge status: Home / Self Care | Payer: Medicaid Other | Source: Ambulatory Visit | Attending: Family Medicine | Admitting: Family Medicine

## 2016-08-23 DIAGNOSIS — Z1231 Encounter for screening mammogram for malignant neoplasm of breast: Secondary | ICD-10-CM | POA: Diagnosis not present

## 2016-08-23 DIAGNOSIS — Z1239 Encounter for other screening for malignant neoplasm of breast: Secondary | ICD-10-CM

## 2016-08-24 ENCOUNTER — Ambulatory Visit (INDEPENDENT_AMBULATORY_CARE_PROVIDER_SITE_OTHER): Payer: Medicaid Other | Admitting: Family Medicine

## 2016-08-24 ENCOUNTER — Telehealth: Payer: Self-pay | Admitting: *Deleted

## 2016-08-24 ENCOUNTER — Encounter: Payer: Self-pay | Admitting: Family Medicine

## 2016-08-24 VITALS — BP 108/63 | HR 63 | Temp 98.4°F | Resp 16 | Ht 64.5 in | Wt 146.0 lb

## 2016-08-24 DIAGNOSIS — Z8619 Personal history of other infectious and parasitic diseases: Secondary | ICD-10-CM

## 2016-08-24 DIAGNOSIS — K219 Gastro-esophageal reflux disease without esophagitis: Secondary | ICD-10-CM | POA: Diagnosis not present

## 2016-08-24 DIAGNOSIS — F339 Major depressive disorder, recurrent, unspecified: Secondary | ICD-10-CM

## 2016-08-24 MED ORDER — SUCRALFATE 1 G PO TABS: 1.0000 g | ORAL_TABLET | Freq: Three times a day (TID) | ORAL | 1 refills | Status: DC

## 2016-08-24 MED ORDER — PAROXETINE HCL 10 MG PO TABS: 10.0000 mg | ORAL_TABLET | Freq: Every day | ORAL | 5 refills | Status: DC

## 2016-08-24 MED ORDER — PANTOPRAZOLE SODIUM 40 MG PO TBEC: 40.0000 mg | DELAYED_RELEASE_TABLET | Freq: Every day | ORAL | 2 refills | Status: DC

## 2016-08-24 NOTE — Telephone Encounter (Signed)
-----   Message from Smitty CordsAlexander J Karamalegos, DO sent at 08/24/2016  3:12 PM EST ----- Regarding: Check on [facility] referral I forgot to mention, I saw this patient earlier today and she requested for me to check on status of [facility], placed in 05/2016. It appears that it is still under review from what I can see under referrals tab. When you get a chance, could you call to find out status? I know they are adding new provider, and this has been difficult to get new patients in, but she is unable to get an update from their office.  Thanks

## 2016-08-24 NOTE — Progress Notes (Signed)
Subjective:    Patient ID: Denise Estrada, female    DOB: [birth year], 45 y.o.   MRN: [id number]  Denise Estrada is a 45 y.o. female presenting on 08/24/2016 for Abdominal Pain (onset for 1 week. tender to touch and food comes up a little . )  HPI  FOLLOW-UP Depression / Anxiety: - Last visit 10/25 for annual physical, discussed this problem further, she had worsening in her mood at that point, requested to resume medication therapy, started back on Paxil 10mg  (half tab for 5mg  daily), this helped her in the past. - Today she describes significant improvement in mood and anxiety. Her stress is much improved, with primary stressor of relationship with family mother and husband all living at home, she spoke to them about to them about this and told her she needed help. Now seems they have been very supportive and she is doing better. - Tolerating Paxil half tab 5mg  daily still, has not increased to 10mg  yet - Improved sleep and most of her symptoms, see PHQ below - Denies any suicidal ideation or thoughts of harming others - Denies any concern for safety or physical abuse  GERD: - Chronic problem, since 2004, last discussed 05/2016, at that time she was taking Zantac 300mg  BID, recommended trial reducing this down to 150 BID, in interval she had noticeable worsening in GERD symptoms with burning and now with more epigastric abdominal pain, sharp pain radiating up to shoulder blades at times, worse with certain trigger foods spicy, spaghetti, greasy fast foods - Reviews her prior history used to take Pronotix for short period of time years ago, then eventually resumed Zantac + protonix, and sucralfate PRN. Admits history of PUD by history but never had EGD, she had prior positive H pylori testing 2007 (treated but is allergic to erythryomycin required other antibiotics) - limit caffeine and coffee, drinks caffeine free soda, avoids acidic drinks OJ - Denies dark stools, unintentional weight loss, chest  pain, dyspnea, nausea, vomiting, dyspepsia   Social History  Substance Use Topics  . Smoking status: Current Every Day Smoker    Packs/day: 0.50    Years: 20.00    Types: Cigarettes  . Smokeless tobacco: Current User  . Alcohol use No    Review of Systems   Per HPI unless specifically indicated above   Depression screen [facility] 2/9 08/24/2016 07/19/2016 06/19/2016  Decreased Interest 0 3 2  Down, Depressed, Hopeless 0 3 2  PHQ - 2 Score 0 6 4  Altered sleeping 0 3 0  Tired, decreased energy 2 3 2   Change in appetite 0 2 0  Feeling bad or failure about yourself  0 3 0  Trouble concentrating 0 2 0  Moving slowly or fidgety/restless 0 3 0  Suicidal thoughts 0 0 0  PHQ-9 Score 2 22 6   Difficult doing work/chores Not difficult at all Very difficult Somewhat difficult      Objective:    BP 108/63 (BP Location: Left Arm, Patient Position: Sitting, Cuff Size: Normal)   Pulse 63   Temp 98.4 F (36.9 C) (Oral)   Resp 16   Ht 5' 4.5" (1.638 m)   Wt 146 lb (66.2 kg)   LMP 08/24/2016   BMI 24.67 kg/m   Wt Readings from Last 3 Encounters:  08/24/16 146 lb (66.2 kg)  07/19/16 147 lb (66.7 kg)  06/19/16 141 lb 9.6 oz (64.2 kg)    Physical Exam  Constitutional: She appears well-developed and well-nourished. No distress.  Well-appearing, comfortable, cooperative  HENT:  Head: Normocephalic and atraumatic.  Mouth/Throat: Oropharynx is clear and moist.  Cardiovascular: Normal rate and intact distal pulses.   No murmur heard. Pulmonary/Chest: Effort normal.  Abdominal: Soft. Bowel sounds are normal. She exhibits no distension and no mass. There is no tenderness.  Neurological: She is alert.  Skin: Skin is warm and dry. No rash noted. She is not diaphoretic.  Psychiatric: She has a normal mood and affect. Her behavior is normal. Thought content normal.  Well groomed, good eye contact, normal thought and speech. Improved positive mood.  Nursing note and vitals reviewed.    I  have personally reviewed the following lab results from 07/14/16.  Results for orders placed or performed in visit on 07/14/16  COMPLETE METABOLIC PANEL WITH GFR  Result Value Ref Range   Sodium 141 135 - 146 mmol/L   Potassium 4.6 3.5 - 5.3 mmol/L   Chloride 105 98 - 110 mmol/L   CO2 27 20 - 31 mmol/L   Glucose, Bld 98 65 - 99 mg/dL   BUN 8 7 - 25 mg/dL   Creat 1.610.73 0.960.50 - 0.451.10 mg/dL   Total Bilirubin 0.5 0.2 - 1.2 mg/dL   Alkaline Phosphatase 67 33 - 115 U/L   AST 25 10 - 35 U/L   ALT 26 6 - 29 U/L   Total Protein 7.1 6.1 - 8.1 g/dL   Albumin 4.4 3.6 - 5.1 g/dL   Calcium 9.5 8.6 - 40.910.2 mg/dL   GFR, Est African American >89 >=60 mL/min   GFR, Est Non African American >89 >=60 mL/min  Lipid panel  Result Value Ref Range   Cholesterol 174 125 - 200 mg/dL   Triglycerides 94 <811<150 mg/dL   HDL 46 >=91>=46 mg/dL   Total CHOL/HDL Ratio 3.8 <=5.0 Ratio   VLDL 19 <30 mg/dL   LDL Cholesterol 478109 <295<130 mg/dL  HIV antibody  Result Value Ref Range   HIV 1&2 Ab, 4th Generation NONREACTIVE NONREACTIVE  Hemoglobin A1c  Result Value Ref Range   Hgb A1c MFr Bld 5.5 <5.7 %   Mean Plasma Glucose 111 mg/dL      Assessment & Plan:   Problem List Items Addressed This Visit    Recurrent depression (HCC)    Significant improvement now on SSRI with Paxil low dose, and improved after patient discussed her concerns with family, causing stress. PHQ9 improved from 22 to 2, more functional  Plan: 1. Refilled Paxil, may continue half tab for 5mg  daily for now, counseled may titrate up to 10mg  daily, refills given 2. May establish with local therapy if needed 3. Follow-up 6 months, future anticipate treat for up to 1 year, discuss may need chronic treatment at low dose, future can consider alternative duloxetine if needed for chronic pain      Relevant Medications   PARoxetine (PAXIL) 10 MG tablet   GERD (gastroesophageal reflux disease) - Primary    Worsening on reduced dose Zantac 150 BID Prior H  pylori, symptoms worse with eating, concern for possible PUD, no prior EGD  Plan: 1. Check H pylori breath test today prior to starting PPI 2. Re-initiate PPI therapy with Protonix 40mg  daily, 30 min prior to first meal, for 2 weeks then if not improving can increase to double dose 40 BID 3. DC Zantac 4. Avoid food triggers 5. Given rx Carafate PRN for symptomatic control 6. Return criteria given, follow-up sooner if not improving otherwise 4 weeks, if H pylori positive will  treat, and if refractory may need GI referral consider future EGD      Relevant Medications   pantoprazole (PROTONIX) 40 MG tablet   sucralfate (CARAFATE) 1 g tablet   Other Relevant Orders   H. pylori breath test    Other Visit Diagnoses    History of Helicobacter pylori infection       Relevant Orders   H. pylori breath test      Meds ordered this encounter  Medications  . pantoprazole (PROTONIX) 40 MG tablet    Sig: Take 1 tablet (40 mg total) by mouth daily. 30 min prior to first meal of day.    Dispense:  30 tablet    Refill:  2  . PARoxetine (PAXIL) 10 MG tablet    Sig: Take 1 tablet (10 mg total) by mouth daily.    Dispense:  30 tablet    Refill:  5  . sucralfate (CARAFATE) 1 g tablet    Sig: Take 1 tablet (1 g total) by mouth 4 (four) times daily -  with meals and at bedtime.    Dispense:  60 tablet    Refill:  1      Follow up plan: Return in about 3 months (around 11/22/2016) for GERD.  Saralyn PilarAlexander Karamalegos, DO [facility] 08/24/2016, 3:34 PM

## 2016-08-24 NOTE — Patient Instructions (Signed)
Thank you for coming in to clinic today.  1. I am concerned about H pylori or peptic ulcer causing your pain - Start the PPI medicine Protonix 40mg  daily Take one capsule 30 min before first meal of day, same time every day for at least 2 weeks,and then let me know how you are doing, we may need to increase to 40mg  TWICE a day - Avoid spicy, greasy, fried foods, also things like caffeine, dark chocolate, peppermint can worsen - Avoid large meals and late night snacks, also do not go more than 4-5 hours without a snack or meal (not eating will worsen reflux symptoms due to stomach acid)  If symptoms are worsening, persistent symptoms despite treatment or develop esophageal or abdominal pain, unable to swallow solids or liquids, nausea, vomiting, fever/chills, or unintentional weight loss / no appetite, please follow-up sooner or seek more immediate medical attention.  Paxil refilled good for 1 year.  Please schedule a follow-up appointment with Dr. Althea CharonKaramalegos in 3 months for GERD   If you have any other questions or concerns, please feel free to call the clinic or send a message through MyChart. You may also schedule an earlier appointment if necessary.  Saralyn PilarAlexander Karamalegos, DO [facility]

## 2016-08-24 NOTE — Assessment & Plan Note (Signed)
Significant improvement now on SSRI with Paxil low dose, and improved after patient discussed her concerns with family, causing stress. PHQ9 improved from 22 to 2, more functional  Plan: 1. Refilled Paxil, may continue half tab for 5mg  daily for now, counseled may titrate up to 10mg  daily, refills given 2. May establish with local therapy if needed 3. Follow-up 6 months, future anticipate treat for up to 1 year, discuss may need chronic treatment at low dose, future can consider alternative duloxetine if needed for chronic pain

## 2016-08-24 NOTE — Assessment & Plan Note (Signed)
Worsening on reduced dose Zantac 150 BID Prior H pylori, symptoms worse with eating, concern for possible PUD, no prior EGD  Plan: 1. Check H pylori breath test today prior to starting PPI 2. Re-initiate PPI therapy with Protonix 40mg  daily, 30 min prior to first meal, for 2 weeks then if not improving can increase to double dose 40 BID 3. DC Zantac 4. Avoid food triggers 5. Given rx Carafate PRN for symptomatic control 6. Return criteria given, follow-up sooner if not improving otherwise 4 weeks, if H pylori positive will treat, and if refractory may need GI referral consider future EGD

## 2016-08-24 NOTE — Telephone Encounter (Signed)
Called [facility] pain clinic to check the status of referral. Left a detailed message awaiting call back.

## 2016-08-25 LAB — H. PYLORI BREATH TEST: H. pylori Breath Test: NOT DETECTED

## 2016-08-31 ENCOUNTER — Other Ambulatory Visit: Payer: Self-pay | Admitting: *Deleted

## 2016-08-31 ENCOUNTER — Other Ambulatory Visit: Payer: Self-pay | Admitting: Family Medicine

## 2016-08-31 ENCOUNTER — Inpatient Hospital Stay
Admission: RE | Admit: 2016-08-31 | Discharge: 2016-08-31 | Disposition: A | Discharge status: Home / Self Care | Payer: Self-pay | Source: Ambulatory Visit | Attending: *Deleted | Admitting: *Deleted

## 2016-08-31 DIAGNOSIS — Z9289 Personal history of other medical treatment: Secondary | ICD-10-CM

## 2016-08-31 DIAGNOSIS — N6320 Unspecified lump in the left breast, unspecified quadrant: Secondary | ICD-10-CM

## 2016-08-31 DIAGNOSIS — R928 Other abnormal and inconclusive findings on diagnostic imaging of breast: Secondary | ICD-10-CM

## 2016-09-07 ENCOUNTER — Ambulatory Visit: Payer: Medicaid Other | Admitting: Family Medicine

## 2016-09-12 ENCOUNTER — Ambulatory Visit
Admission: RE | Admit: 2016-09-12 | Discharge: 2016-09-12 | Disposition: A | Discharge status: Home / Self Care | Payer: Medicaid Other | Source: Ambulatory Visit | Attending: Family Medicine | Admitting: Family Medicine

## 2016-09-12 DIAGNOSIS — R928 Other abnormal and inconclusive findings on diagnostic imaging of breast: Secondary | ICD-10-CM | POA: Diagnosis present

## 2016-09-12 DIAGNOSIS — N6320 Unspecified lump in the left breast, unspecified quadrant: Secondary | ICD-10-CM

## 2016-09-21 ENCOUNTER — Other Ambulatory Visit: Payer: Medicaid Other

## 2016-09-21 ENCOUNTER — Ambulatory Visit: Payer: Medicaid Other

## 2017-01-08 DIAGNOSIS — G894 Chronic pain syndrome: Secondary | ICD-10-CM | POA: Insufficient documentation

## 2017-01-08 DIAGNOSIS — G8929 Other chronic pain: Secondary | ICD-10-CM | POA: Insufficient documentation

## 2017-01-08 NOTE — Progress Notes (Deleted)
Patient's Name: Denise Estrada  MRN: [id number]  Referring Provider: Nobie Putnam *  DOB: 1971/02/17  PCP: Olin Hauser, DO  DOS: 01/09/2017  Note by: Kathlen Brunswick. Dossie Arbour, MD  Service setting: Ambulatory outpatient  Specialty: Interventional Pain Management  Location: [facility] (AMB) Pain Management Facility    Patient type: New Patient   Primary Reason(s) for Visit: Initial Patient Evaluation CC: No chief complaint on file.  HPI  Denise Estrada is a 46 y.o. year old, female patient, who comes today for an initial evaluation. She has Anxiety; Chronic shoulder pain (Left); COPD (chronic obstructive pulmonary disease) ([place]); Tobacco abuse; Fibrocystic breast disease; GERD (gastroesophageal reflux disease); Herniated nucleus pulposus, L5-S1, right; Hyperlipidemia; Migraine; Recurrent depression (HCC); DJD (degenerative joint disease) of cervical spine; Degenerative joint disease (DJD) of lumbar spine; Perimenopausal; and Chronic pain syndrome on her problem list.. Her primarily concern today is the No chief complaint on file.  Pain Assessment: Self-Reported Pain Score:  /10             Reported level is compatible with observation.          Onset and Duration: {Hx; Onset and Duration:210120511} Cause of pain: {Hx; Cause:210120521} Severity: {Pain Severity:210120502} Timing: {Symptoms; Timing:210120501} Aggravating Factors: {Causes; Aggravating pain factors:210120507} Alleviating Factors: {Causes; Alleviating Factors:210120500} Associated Problems: {Hx; Associated problems:210120515} Quality of Pain: {Hx; Symptom quality or Descriptor:210120531} Previous Examinations or Tests: {Hx; Previous examinations or test:210120529} Previous Treatments: {Hx; Previous Treatment:210120503}  The patient comes into the clinics today for the first time for a chronic pain management evaluation. ***  Today I took the time to provide the patient with information regarding my pain practice. The patient  was informed that my practice is divided into two sections: an interventional pain management section, as well as a completely separate and distinct medication management section. I explained that I have procedure days for my interventional therapies, and evaluation days for follow-ups and medication management. Because of the amount of documentation required during both, they are kept separated. This means that there is the possibility that she may be scheduled for a procedure on one day, and medication management the next. I have also informed her that because of staffing and facility limitations, I no longer take patients for medication management only. To illustrate the reasons for this, I gave the patient the example of surgeons, and how inappropriate it would be to refer a patient to his/her care, just to write for the post-surgical antibiotics on a surgery done by a different surgeon.   Because interventional pain management is my board-certified specialty, the patient was informed that joining my practice means that they are open to any and all interventional therapies. I made it clear that this does not mean that they will be forced to have any procedures done. What this means is that I believe interventional therapies to be essential part of the diagnosis and proper management of chronic pain conditions. Therefore, patients not interested in these interventional alternatives will be better served under the care of a different practitioner.  The patient was also made aware of my Comprehensive Pain Management Safety Guidelines where by joining my practice, they limit all of their nerve blocks and joint injections to those done by our practice, for as long as we are retained to manage their care.   Historic Controlled Substance Pharmacotherapy Review  PMP and historical list of controlled substances: *** Highest analgesic regimen found: *** Most recent analgesic: *** Highest recorded MME/day: ***  mg/day MME/day: ***  mg/day Medications: The patient did not bring the medication(s) to the appointment, as requested in our "New Patient Package" Pharmacodynamics: Desired effects: Analgesia: The patient reports >50% benefit. Reported improvement in function: The patient reports medication allows her to accomplish basic ADLs. Clinically meaningful improvement in function (CMIF): Sustained CMIF goals met Perceived effectiveness: Described as relatively effective, allowing for increase in activities of daily living (ADL) Undesirable effects: Side-effects or Adverse reactions: None reported Historical Monitoring: The patient  reports that she does not use drugs.. List of all UDS Test(s): No results found for: MDMA, COCAINSCRNUR, PCPSCRNUR, PCPQUANT, CANNABQUANT, THCU, [place] List of all Serum Drug Screening Test(s):  No results found for: AMPHSCRSER, BARBSCRSER, BENZOSCRSER, COCAINSCRSER, PCPSCRSER, PCPQUANT, THCSCRSER, CANNABQUANT, OPIATESCRSER, OXYSCRSER, PROPOXSCRSER Historical Background Evaluation: [place] PDMP: Six (6) year initial data search conducted.             [place] Department of public safety, offender search: Editor, commissioning Information) Non-contributory Risk Assessment Profile: Aberrant behavior: None observed or detected today Risk factors for fatal opioid overdose: None identified today Fatal overdose hazard ratio (HR): Calculation deferred Non-fatal overdose hazard ratio (HR): Calculation deferred Risk of opioid abuse or dependence: 0.7-3.0% with doses ? 36 MME/day and 6.1-26% with doses ? 120 MME/day. Substance use disorder (SUD) risk level: Pending results of Medical Psychology Evaluation for SUD Opioid risk tool (ORT) (Total Score):    ORT Scoring interpretation table:  Score <3 = Low Risk for SUD  Score between 4-7 = Moderate Risk for SUD  Score >8 = High Risk for Opioid Abuse   PHQ-2 Depression Scale:  Total score:    PHQ-2 Scoring interpretation table: (Score and probability of  major depressive disorder)  Score 0 = No depression  Score 1 = 15.4% Probability  Score 2 = 21.1% Probability  Score 3 = 38.4% Probability  Score 4 = 45.5% Probability  Score 5 = 56.4% Probability  Score 6 = 78.6% Probability   PHQ-9 Depression Scale:  Total score:    PHQ-9 Scoring interpretation table:  Score 0-4 = No depression  Score 5-9 = Mild depression  Score 10-14 = Moderate depression  Score 15-19 = Moderately severe depression  Score 20-27 = Severe depression (2.4 times higher risk of SUD and 2.89 times higher risk of overuse)   Pharmacologic Plan: Pending ordered tests and/or consults  Meds  The patient has a current medication list which includes the following prescription(s): metaxalone, pantoprazole, paroxetine, and sucralfate.  Current Outpatient Prescriptions on File Prior to Visit  Medication Sig  . metaxalone (SKELAXIN) 800 MG tablet Take 0.5-1 tablets (400-800 mg total) by mouth 2 (two) times daily as needed for muscle spasms.  . pantoprazole (PROTONIX) 40 MG tablet Take 1 tablet (40 mg total) by mouth daily. 30 min prior to first meal of day.  Marland Kitchen PARoxetine (PAXIL) 10 MG tablet Take 1 tablet (10 mg total) by mouth daily.  . sucralfate (CARAFATE) 1 g tablet Take 1 tablet (1 g total) by mouth 4 (four) times daily -  with meals and at bedtime.   No current facility-administered medications on file prior to visit.    Imaging Review  Cervical Imaging: Cervical MR w/wo contrast:  Results for orders placed in visit on 04/13/07  MR Cervical Spine W Wo Contrast   Narrative * PRIOR REPORT IMPORTED FROM AN EXTERNAL SYSTEM *   PRIOR REPORT IMPORTED FROM THE [facility] WORKFLOW SYSTEM   REASON FOR EXAM:    neck pain    sensory dysesthesia  COMMENTS:  PROCEDURE:     MR  - MR CERVICAL SPINE WO/W  - Apr 13 2007  2:37PM   RESULT:     Comparison: No available comparison exam.   Procedure: Multiplanar, multisequence MR examination of the cervical spine  was performed  without and with gadolinium contrast.   Findings:   There is straightening of the cervical spine without malalignment. The  cervical cord and cervicomedullary junction are is unremarkable. No  cervical  fracture is noted. No osseous lesion is seen. Multilevel disc desiccation  are seen involving the cervical spine with minimal disc space height loss.  There is no significant disc herniation. There is no significant canal or  neural foraminal narrowing at any of the cervical levels. No pathological  contrast enhancement is noted.   IMPRESSION:   1. Minimal degenerative changes of the cervical spine without significant  cervical canal or neural foraminal narrowing.   Thank you for this opportunity to contribute to the care of your patient.       Note: Available results from prior imaging studies were reviewed.        ROS  Cardiovascular History: {Hx; Cardiovascular History:210120525} Pulmonary or Respiratory History: {Hx; Pumonary and/or Respiratory History:210120523} Neurological History: {Hx; Neurological:210120504} Review of Past Neurological Studies: No results found for this or any previous visit. Psychological-Psychiatric History: {Hx; Psychological-Psychiatric History:210120512} Gastrointestinal History: {Hx; Gastrointestinal:210120527} Genitourinary History: {Hx; Genitourinary:210120506} Hematological History: {Hx; Hematological:210120510} Endocrine History: {Hx; Endocrine history:210120509} Rheumatologic History: {Hx; Rheumatological:210120530} Musculoskeletal History: {Hx; Musculoskeletal:210120528} Work History: {Hx; Work history:210120514}  Allergies  Ms. Suchan is allergic to erythromycin base; lansoprazole; sulfamethoxazole-trimethoprim; and tramadol.  Laboratory Chemistry  Inflammation Markers No results found for: CRP, ESRSEDRATE (CRP: Acute Phase) (ESR: Chronic Phase) Renal Function Markers Lab Results  Component Value Date   BUN 8 07/17/2016   CREATININE  0.73 07/17/2016   GFRAA >89 07/17/2016   GFRNONAA >89 07/17/2016   Hepatic Function Markers Lab Results  Component Value Date   AST 25 07/17/2016   ALT 26 07/17/2016   ALBUMIN 4.4 07/17/2016   ALKPHOS 67 07/17/2016   Electrolytes Lab Results  Component Value Date   NA 141 07/17/2016   K 4.6 07/17/2016   CL 105 07/17/2016   CALCIUM 9.5 07/17/2016   Neuropathy Markers No results found for: [id number] Bone Pathology Markers Lab Results  Component Value Date   ALKPHOS 67 07/17/2016   CALCIUM 9.5 07/17/2016   Coagulation Parameters No results found for: INR, LABPROT, APTT, PLT Cardiovascular Markers No results found for: BNP, HGB, HCT Note: Lab results reviewed.  [place]  Drug: Ms. Picco  reports that she does not use drugs. Alcohol:  reports that she does not drink alcohol. Tobacco:  reports that she has been smoking Cigarettes.  She has a 10.00 pack-year smoking history. She uses smokeless tobacco. Medical:  has a past medical history of Depression; GERD (gastroesophageal reflux disease); Hyperlipidemia; and Ulcer of esophagus. Family: family history includes Breast cancer in her maternal grandmother; Cancer in her father and maternal grandmother; Depression in her mother; Diabetes in her brother, father, maternal grandfather, mother, paternal grandmother, and son; Heart attack in her brother and maternal grandfather; Heart disease in her father and mother; Hypertension in her brother and maternal grandmother; Stroke in her father, maternal grandfather, and mother.  Past Surgical History:  Procedure Laterality Date  . biopsy cervix  1996/1997  . BREAST BIOPSY Right 2007   neg  . BREAST SURGERY     biopsy   Active Ambulatory Problems  Diagnosis Date Noted  . Anxiety 09/09/2015  . Chronic shoulder pain (Left) 09/09/2015  . COPD (chronic obstructive pulmonary disease) ([place]) 09/09/2015  . Tobacco abuse 09/09/2015  . Fibrocystic breast disease 09/09/2015  . GERD  (gastroesophageal reflux disease) 09/09/2015  . Herniated nucleus pulposus, L5-S1, right 09/09/2015  . Hyperlipidemia 09/09/2015  . Migraine 09/09/2015  . Recurrent depression ([place]) 09/09/2015  . DJD (degenerative joint disease) of cervical spine 06/19/2016  . 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 Perimenopausal 06/19/2016  . Chronic pain syndrome 01/08/2017   Resolved Ambulatory Problems    Diagnosis Date Noted  . No Resolved Ambulatory Problems   Past Medical History:  Diagnosis Date  . Depression   . GERD (gastroesophageal reflux disease)   . Hyperlipidemia   . Ulcer of esophagus    Constitutional Exam  General appearance: Well nourished, well developed, and well hydrated. In no apparent acute distress There were no vitals filed for this visit. BMI Assessment: Estimated body mass index is 24.67 kg/m as calculated from the following:   Height as of 08/24/16: 5' 4.5" (1.638 m).   Weight as of 08/24/16: 146 lb (66.2 kg).  BMI interpretation table: BMI level Category Range association with higher incidence of chronic pain  <18 kg/m2 Underweight   18.5-24.9 kg/m2 Ideal body weight   25-29.9 kg/m2 Overweight Increased incidence by 20%  30-34.9 kg/m2 Obese (Class I) Increased incidence by 68%  35-39.9 kg/m2 Severe obesity (Class II) Increased incidence by 136%  >40 kg/m2 Extreme obesity (Class III) Increased incidence by 254%   BMI Readings from Last 4 Encounters:  08/24/16 24.67 kg/m  07/19/16 25.23 kg/m  06/19/16 24.31 kg/m   Wt Readings from Last 4 Encounters:  08/24/16 146 lb (66.2 kg)  07/19/16 147 lb (66.7 kg)  06/19/16 141 lb 9.6 oz (64.2 kg)  Psych/Mental status: Alert, oriented x 3 (person, place, & time)       Eyes: PERLA Respiratory: No evidence of acute respiratory distress  Cervical Spine Exam  Inspection: No masses, redness, or swelling Alignment: Symmetrical Functional ROM: Unrestricted ROM Stability: No instability  detected Muscle strength & Tone: Functionally intact Sensory: Unimpaired Palpation: No palpable anomalies  Upper Extremity (UE) Exam    Side: Right upper extremity  Side: Left upper extremity  Inspection: No masses, redness, swelling, or asymmetry. No contractures  Inspection: No masses, redness, swelling, or asymmetry. No contractures  Functional ROM: Unrestricted ROM          Functional ROM: Unrestricted ROM          Muscle strength & Tone: Functionally intact  Muscle strength & Tone: Functionally intact  Sensory: Unimpaired  Sensory: Unimpaired  Palpation: No palpable anomalies  Palpation: No palpable anomalies  Specialized Test(s): Deferred         Specialized Test(s): Deferred          Thoracic Spine Exam  Inspection: No masses, redness, or swelling Alignment: Symmetrical Functional ROM: Unrestricted ROM Stability: No instability detected Sensory: Unimpaired Muscle strength & Tone: No palpable anomalies  Lumbar Spine Exam  Inspection: No masses, redness, or swelling Alignment: Symmetrical Functional ROM: Unrestricted ROM Stability: No instability detected Muscle strength & Tone: Functionally intact Sensory: Unimpaired Palpation: No palpable anomalies Provocative Tests: Lumbar Hyperextension and rotation test: evaluation deferred today       Patrick's Maneuver: evaluation deferred today              Gait & Posture Assessment  Ambulation: Unassisted Gait: Relatively normal for  age and body habitus Posture: WNL   Lower Extremity Exam    Side: Right lower extremity  Side: Left lower extremity  Inspection: No masses, redness, swelling, or asymmetry. No contractures  Inspection: No masses, redness, swelling, or asymmetry. No contractures  Functional ROM: Unrestricted ROM          Functional ROM: Unrestricted ROM          Muscle strength & Tone: Functionally intact  Muscle strength & Tone: Functionally intact  Sensory: Unimpaired  Sensory: Unimpaired  Palpation: No palpable  anomalies  Palpation: No palpable anomalies   Assessment  Primary Diagnosis & Pertinent Problem List: The encounter diagnosis was Chronic pain syndrome.  Visit Diagnosis: 1. Chronic pain syndrome    Plan of Care  Initial treatment plan:  Please be advised that as per protocol, today's visit has been an evaluation only. We have not taken over the patient's controlled substance management.  Problem-specific plan: No problem-specific Assessment & Plan notes found for this encounter.  Ordered Lab-work, Procedure(s), Referral(s), & Consult(s): No orders of the defined types were placed in this encounter.  Pharmacotherapy: Medications ordered:  No orders of the defined types were placed in this encounter.  Medications administered during this visit: Ms. Uffelman had no medications administered during this visit.   Pharmacotherapy under consideration:  Opioid Analgesics: The patient was informed that there is no guarantee that she would be a candidate for opioid analgesics. The decision will be made following CDC guidelines. This decision will be based on the results of diagnostic studies, as well as Ms. Akers's risk profile.  Membrane stabilizer: To be determined at a later time Muscle relaxant: To be determined at a later time NSAID: To be determined at a later time Other analgesic(s): To be determined at a later time   Interventional therapies under consideration: Ms. Michalski was informed that there is no guarantee that she would be a candidate for interventional therapies. The decision will be based on the results of diagnostic studies, as well as Ms. Poli's risk profile.  Possible procedure(s): ***   Provider-requested follow-up: No Follow-up on file.  Future Appointments Date Time Provider [place]  01/09/2017 9:15 AM Milinda Pointer, MD [facility] None  07/24/2017 9:00 AM Olin Hauser, DO [facility] None    Primary Care Physician: Olin Hauser,  DO Location: [facility] Note by: Kathlen Brunswick. Dossie Arbour, M.D, DABA, DABAPM, DABPM, DABIPP, FIPP Date: 01/09/2017; Time: 9:00 PM  Pain Score Disclaimer: We use the NRS-11 scale. This is a self-reported, subjective measurement of pain severity with only modest accuracy. It is used primarily to identify changes within a particular patient. It must be understood that outpatient pain scales are significantly less accurate that those used for research, where they can be applied under ideal controlled circumstances with minimal exposure to variables. In reality, the score is likely to be a combination of pain intensity and pain affect, where pain affect describes the degree of emotional arousal or changes in action readiness caused by the sensory experience of pain. Factors such as social and work situation, setting, emotional state, anxiety levels, expectation, and prior pain experience may influence pain perception and show large inter-individual differences that may also be affected by time variables.  Patient instructions provided during this appointment: There are no Patient Instructions on file for this visit.

## 2017-01-09 ENCOUNTER — Ambulatory Visit: Payer: Self-pay | Admitting: Pain Medicine

## 2017-01-27 ENCOUNTER — Other Ambulatory Visit: Payer: Self-pay | Admitting: Family Medicine

## 2017-01-27 DIAGNOSIS — M4726 Other spondylosis with radiculopathy, lumbar region: Secondary | ICD-10-CM

## 2017-01-27 DIAGNOSIS — G8929 Other chronic pain: Secondary | ICD-10-CM

## 2017-01-27 DIAGNOSIS — M47812 Spondylosis without myelopathy or radiculopathy, cervical region: Secondary | ICD-10-CM

## 2017-03-02 ENCOUNTER — Telehealth: Payer: Self-pay | Admitting: Family Medicine

## 2017-03-02 DIAGNOSIS — G894 Chronic pain syndrome: Secondary | ICD-10-CM

## 2017-03-02 DIAGNOSIS — M47812 Spondylosis without myelopathy or radiculopathy, cervical region: Secondary | ICD-10-CM

## 2017-03-02 MED ORDER — BACLOFEN 10 MG PO TABS: 5.0000 mg | ORAL_TABLET | Freq: Three times a day (TID) | ORAL | 3 refills | Status: DC | PRN

## 2017-03-02 NOTE — Telephone Encounter (Signed)
Received PA request for Metaxalone. Called patient, she reports that she has tried and failed Tramadol, Mobic, and Flexeril before and could not tolerate. She has had improvement on Metaxalone. Has not tried Baclofen, willing to try this, will send in new rx 10mg  TID PRN. Notify us if not helping or cant tolerate, and will submit PA request for Metaxalone.  Saralyn PilarAlexander Adalynn Corne, DO [facility] 03/02/2017, 4:46 PM

## 2017-04-19 ENCOUNTER — Telehealth: Payer: Self-pay

## 2017-04-19 NOTE — Telephone Encounter (Signed)
Pt advised.

## 2017-04-19 NOTE — Telephone Encounter (Signed)
Last saw patient in 06/2016 and 07/2016, discussed her perimenopausal symptoms (not full menopause) she has had irregular intermittent bleeding >1 to 2 years now, which can be normal. It may take a few years for menopause to fully begin and before she will be free of period.  I recommend that she keep track of current cycle and bleeding, and if acts like a regular period and resolves, then continue to monitor if has another episode. If persistent bleeding or other abnormal symptoms pelvic pain, then may consider having her follow-up sooner, and we can discuss a Pelvic Ultrasound test and possibly GYN referral to have endometrial biopsy.  Saralyn PilarAlexander Karamalegos, DO [facility] 04/19/2017, 12:41 PM

## 2017-04-19 NOTE — Telephone Encounter (Signed)
Pt had perimenopausal Sx and concerned about getting period today after year please suggest?

## 2017-07-11 ENCOUNTER — Other Ambulatory Visit: Payer: Self-pay | Admitting: Family Medicine

## 2017-07-11 DIAGNOSIS — K219 Gastro-esophageal reflux disease without esophagitis: Secondary | ICD-10-CM

## 2017-07-24 ENCOUNTER — Ambulatory Visit (INDEPENDENT_AMBULATORY_CARE_PROVIDER_SITE_OTHER): Payer: Medicaid Other | Admitting: Family Medicine

## 2017-07-24 ENCOUNTER — Encounter: Payer: Self-pay | Admitting: Family Medicine

## 2017-07-24 VITALS — BP 116/70 | HR 79 | Temp 98.2°F | Resp 16 | Ht 64.5 in | Wt 158.0 lb

## 2017-07-24 DIAGNOSIS — M4726 Other spondylosis with radiculopathy, lumbar region: Secondary | ICD-10-CM

## 2017-07-24 DIAGNOSIS — K219 Gastro-esophageal reflux disease without esophagitis: Secondary | ICD-10-CM | POA: Diagnosis not present

## 2017-07-24 DIAGNOSIS — J432 Centrilobular emphysema: Secondary | ICD-10-CM

## 2017-07-24 DIAGNOSIS — Z13220 Encounter for screening for lipoid disorders: Secondary | ICD-10-CM

## 2017-07-24 DIAGNOSIS — Z131 Encounter for screening for diabetes mellitus: Secondary | ICD-10-CM

## 2017-07-24 DIAGNOSIS — G894 Chronic pain syndrome: Secondary | ICD-10-CM | POA: Diagnosis not present

## 2017-07-24 DIAGNOSIS — R635 Abnormal weight gain: Secondary | ICD-10-CM

## 2017-07-24 DIAGNOSIS — Z0000 Encounter for general adult medical examination without abnormal findings: Secondary | ICD-10-CM | POA: Diagnosis not present

## 2017-07-24 DIAGNOSIS — Z1239 Encounter for other screening for malignant neoplasm of breast: Secondary | ICD-10-CM

## 2017-07-24 DIAGNOSIS — M7711 Lateral epicondylitis, right elbow: Secondary | ICD-10-CM | POA: Diagnosis not present

## 2017-07-24 DIAGNOSIS — Z1231 Encounter for screening mammogram for malignant neoplasm of breast: Secondary | ICD-10-CM

## 2017-07-24 DIAGNOSIS — F3340 Major depressive disorder, recurrent, in remission, unspecified: Secondary | ICD-10-CM

## 2017-07-24 MED ORDER — METAXALONE 800 MG PO TABS: 400.0000 mg | ORAL_TABLET | Freq: Three times a day (TID) | ORAL | 3 refills | Status: DC

## 2017-07-24 MED ORDER — RANITIDINE HCL 150 MG PO TABS: 150.0000 mg | ORAL_TABLET | Freq: Two times a day (BID) | ORAL | 5 refills | Status: DC

## 2017-07-24 NOTE — Patient Instructions (Addendum)
Thank you for coming to the clinic today.  1.  Recommend contacting [facility] about next due screening mammogram, your last one was finalized 09/12/16, and recommended repeat in 1 year. Please call them to check status of this within November.  Next pap smear can be 2019 to 2021  2.  Most likely lateral epicondylitis, muscle tendonitis of elbow and forearm  Try wearing the arm brace LOWER over the lower elbow area put the pad over the muscle  Use RICE therapy: - R - Rest / relative rest with activity modification avoid overuse of joint - I - Ice packs (make sure you use a towel or sock / something to protect skin) / OR HEAT - C - Compression with BRACE or ACE wrap to apply pressure and reduce swelling allowing more support - E - Elevation - if significant swelling, can elevate  Recommend trial of Anti-inflammatory with Aleve OTC 250mg  tabs - take 1 to 2 with food and plenty of water TWICE daily every day (breakfast and dinner), for next 2 weeks, then you may take only as needed - DO NOT TAKE any ibuprofen, aleve, motrin while you are taking this medicine - It is safe to take Tylenol Ext Str 500mg  tabs - take 1 to 2 (max dose 1000mg ) every 6 hours as needed for breakthrough pain, max 24 hour daily dose is 6 to 8 tablets or 4000mg   In future we can consider topical voltaren gel, x-rays, referral to Ortho or referral to PT  DUE for FASTING BLOOD WORK (no food or drink after midnight before the lab appointment, only water or coffee without cream/sugar on the morning of)  LAB TODAY  Please schedule a Follow-up Appointment to: Return in about 6 months (around 01/22/2018) for GERD, Mood/Anxiety PHQ/GAD, R Tennis Elbow.  If you have any other questions or concerns, please feel free to call the clinic or send a message through MyChart. You may also schedule an earlier appointment if necessary.  Additionally, you may be receiving a survey about your experience at our clinic within a few days  to 1 week by e-mail or mail. We value your feedback.  Saralyn PilarAlexander Kanai Berrios, DO [facility]

## 2017-07-24 NOTE — Progress Notes (Signed)
Subjective:    Patient ID: Denise Estrada, female    DOB: 1971/05/24, 46 y.o.   MRN: [id number]  Denise SchimkeJanet E Estrada is a 46 y.o. female presenting on 07/24/2017 for Annual Exam   HPI   Here for Annual Exam and due for fasting labs (did have coffee with creamer)  Depression / Anxiety: - Last visit with me 08/24/16, for same problem, treated with continued Paxil, see prior notes for background information. - Interval update with dramatic improvement in her anxiety/stress and mood after her mother moved out of house, this was major source of her stress/anxiety, and now relationship with husband is better. - Today patient reports doing very well. She has self discontinued Paxil in August 2018. 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 Was switched to baclofen but not as effective, also has failed flexeril in past. - Today reports overall back is improved, still causes problems occasionally mild intermittent flares, history of radicular pain in R leg sciatica - Taking some Tylenol - Denies any saddle anesthesia, bowel or bladder incontinence  HYPERLIPIDEMIA: - Last fasting lipid panel 07/17/16, reviewed results today with well controlled cholesterol. No longer on statin, previously on Crestor 20mg  x 3 months and Fish Oil with good results. - Not taking ASA daily, due to history of GERD  TOBACCO ABUSE / COPD - Active smoker, using e cigs, cut down to < 0.5pp still but not able to quit. Feels like she may be ready in near future, but wants to slowly wean down on  her own. - No problems with COPD, not on any inhalers has never had significant flare up or hospitalization  Lateral Epicondylitis / R elbow pain  - Reports new problem today with concern possible injury 3 months ago, episode following grandson "flipped" over R forearm while holding him, pain in forearm especially if lifting heavier objects or if moves the wrong way, she is continually active with her grandson and seems to keep aggravating it not allowing it to heal. She was recommended to use a muscle strap but above the elbow  Additional complaint - Still bothered by feeling under L ribcage, "can feel it" but not painful anymore, thinks related to posture  Health Maintenance: - History of abnormal pap smear 1997 (abnormal cells, s/p cervical biopsy, removing large portion), again around 2010/2011 had abnormal cells and dx with HPV. Last pap smear 2016, normal and negative HPV. Next due 2019-2021 - Last mammo 07/2015, due for next 07/2016, prior breast biopsy 2010 (negative). Maternal grandmother with breast cancer age 44>55-60  - Declines Flu shot, offered again, counseled on risks and benefits  Dep[facility] screen [facility] 07/24/2017 08/24/2016 07/19/2016  Decreased Interest 0 0 3  Down, Depressed, Hopeless 0 0 3  PHQ - 2 Score 0 0 6  Altered sleeping 0 0 3  Tired, decreased energy 0 2 3  Change in appetite 0 0 2  Feeling bad or failure about yourself  0 0 3  Trouble concentrating 0 0 2  Moving slowly or fidgety/restless 0 0  3  Suicidal thoughts 0 0 0  PHQ-9 Score 0 2 22  Difficult doing work/chores Not difficult at all Not difficult at all Very difficult   No flowsheet data found.  Past Medical History:  Diagnosis Date  . Depression   . GERD (gastroesophageal reflux disease)   . Hyperlipidemia   . Ulcer of esophagus    Past Surgical History:  Procedure Laterality Date  . biopsy cervix  1996/1997  . BREAST BIOPSY Right 2007   neg  . BREAST SURGERY     biopsy   Social  History   Social History  . Marital status: Married    Spouse name: N/A  . Number of children: N/A  . Years of education: N/A   Occupational History  . Not on file.   Social History Main Topics  . Smoking status: Current Every Day Smoker    Packs/day: 0.50    Years: 20.00    Types: Cigarettes  . Smokeless tobacco: Current User     Comment: 3/4 pack for most of 20 years  . Alcohol use No  . Drug use: No  . Sexual activity: Yes    Birth control/ protection: None   Other Topics Concern  . Not on file   Social History Narrative  . No narrative on file   Family History  Problem Relation Age of Onset  . Heart disease Mother   . Stroke Mother   . Diabetes Mother   . Depression Mother   . Cancer Father        lung  . Heart disease Father   . Stroke Father   . Diabetes Father   . Lung cancer Father   . Heart attack Brother   . Diabetes Brother   . Hypertension Brother   . Diabetes Son        type 1  . Cancer Maternal Grandmother   . Hypertension Maternal Grandmother   . Breast cancer Maternal Grandmother        unsure late 34's?  . Stroke Maternal Grandfather   . Heart attack Maternal Grandfather   . Diabetes Maternal Grandfather   . Diabetes Paternal Grandmother    No current outpatient prescriptions on file prior to visit.   No current facility-administered medications on file prior to visit.     Review of Systems  Constitutional: Negative for activity change, appetite change, chills, diaphoresis, fatigue and fever.  HENT: Negative for congestion and hearing loss.   Eyes: Negative for visual disturbance.  Respiratory: Negative for apnea, cough, choking, chest tightness, shortness of breath and wheezing.   Cardiovascular: Negative for chest pain, palpitations and leg swelling.  Gastrointestinal: Negative for abdominal pain, anal bleeding, blood in stool, constipation, diarrhea, nausea and vomiting.  Endocrine: Negative for cold intolerance and polyuria.    Genitourinary: Negative for decreased urine volume, difficulty urinating, dysuria, frequency, hematuria and urgency.  Musculoskeletal: Negative for arthralgias and neck pain.  Skin: Negative for rash.  Allergic/Immunologic: Negative for environmental allergies.  Neurological: Negative for dizziness, weakness, light-headedness, numbness and headaches.  Hematological: Negative for adenopathy.  Psychiatric/Behavioral: Negative for behavioral problems, dysphoric mood (improved) and sleep disturbance. The patient is not nervous/anxious (dramatically improved).    Per HPI unless specifically indicated above     Objective:    BP 116/70   Pulse 79   Temp 98.2 F (36.8 C) (Oral)   Resp 16   Ht 5' 4.5" (1.638 m)   Wt 158 lb (71.7 kg)   BMI 26.70  kg/m   Wt Readings from Last 3 Encounters:  07/24/17 158 lb (71.7 kg)  08/24/16 146 lb (66.2 kg)  07/19/16 147 lb (66.7 kg)    Physical Exam  Constitutional: She is oriented to person, place, and time. She appears well-developed and well-nourished. No distress.  Well-appearing, comfortable, cooperative  HENT:  Head: Normocephalic and atraumatic.  Mouth/Throat: Oropharynx is clear and moist.  Eyes: Pupils are equal, round, and reactive to light. Conjunctivae and EOM are normal. Right eye exhibits no discharge. Left eye exhibits no discharge.  Neck: Normal range of motion. Neck supple. No thyromegaly present.  Cardiovascular: Normal rate, regular rhythm, normal heart sounds and intact distal pulses.   No murmur heard. Pulmonary/Chest: Effort normal and breath sounds normal. No respiratory distress. She has no wheezes. She has no rales.  Abdominal: Soft. Bowel sounds are normal. She exhibits no distension and no mass. There is no tenderness.  Musculoskeletal: Normal range of motion. She exhibits no edema.  Upper / Lower Extremities: - Normal muscle tone, strength bilateral upper extremities 5/5, lower extremities 5/5  Right elbow/upper  extremity Inspection: normal appearance Palpation: tender over extensor forearm muscle bulk, not over bony process of proximal radius or olceranon, no edema or swollen bursa ROM: full active ROM elbow flex with some reduced full extension due to stiffness and discomfort Strength: 5/5 distal strength flex/ext wrist grip Neurovascular: distally intact with some mild reproduced tingling in fingers  Lymphadenopathy:    She has no cervical adenopathy.  Neurological: She is alert and oriented to person, place, and time.  Distal sensation intact to light touch all extremities  Skin: Skin is warm and dry. No rash noted. She is not diaphoretic. No erythema.  Psychiatric: She has a normal mood and affect. Her behavior is normal.  Well groomed, good eye contact, normal speech and thoughts Significantly improved mood today. Not appearing anxious  Nursing note and vitals reviewed.  Results for orders placed or performed in visit on 07/24/17  COMPLETE METABOLIC PANEL WITH GFR  Result Value Ref Range   Glucose, Bld 72 65 - 139 mg/dL   BUN 7 7 - 25 mg/dL   Creat 1.61 0.96 - 0.45 mg/dL   GFR, Est Non African American 89 > OR = 60 mL/min/1.8m2   GFR, Est African American 103 > OR = 60 mL/min/1.67m2   BUN/Creatinine Ratio NOT APPLICABLE 6 - 22 (calc)   Sodium 138 135 - 146 mmol/L   Potassium 4.1 3.5 - 5.3 mmol/L   Chloride 103 98 - 110 mmol/L   CO2 27 20 - 32 mmol/L   Calcium 9.5 8.6 - 10.2 mg/dL   Total Protein 7.3 6.1 - 8.1 g/dL   Albumin 4.4 3.6 - 5.1 g/dL   Globulin 2.9 1.9 - 3.7 g/dL (calc)   AG Ratio 1.5 1.0 - 2.5 (calc)   Total Bilirubin 0.4 0.2 - 1.2 mg/dL   Alkaline phosphatase (APISO) 92 33 - 115 U/L   AST 18 10 - 35 U/L   ALT 24 6 - 29 U/L  Lipid panel  Result Value Ref Range   Cholesterol 150 <200 mg/dL   HDL 40 (L) >40 mg/dL   Triglycerides 981 <191 mg/dL   LDL Cholesterol (Calc) 89 mg/dL (calc)   Total CHOL/HDL Ratio 3.8 <5.0 (calc)   Non-HDL Cholesterol (Calc) 110 <130 mg/dL  (calc)  TSH  Result Value Ref Range   TSH 1.04 mIU/L  CBC with Differential/Platelet  Result Value Ref Range   WBC 7.2 3.8 -  10.8 Thousand/uL   RBC 4.95 3.80 - 5.10 Million/uL   Hemoglobin 15.1 11.7 - 15.5 g/dL   HCT 60.4 54.0 - 98.1 %   MCV 89.1 80.0 - 100.0 fL   MCH 30.5 27.0 - 33.0 pg   MCHC 34.2 32.0 - 36.0 g/dL   RDW 19.1 47.8 - 29.5 %   Platelets 226 140 - 400 Thousand/uL   MPV 11.8 7.5 - 12.5 fL   Neutro Abs 4,075 1,500 - 7,800 cells/uL   Lymphs Abs 2,614 850 - 3,900 cells/uL   WBC mixed population 382 200 - 950 cells/uL   Eosinophils Absolute 101 15 - 500 cells/uL   Basophils Absolute 29 0 - 200 cells/uL   Neutrophils Relative % 56.6 %   Total Lymphocyte 36.3 %   Monocytes Relative 5.3 %   Eosinophils Relative 1.4 %   Basophils Relative 0.4 %      Assessment & Plan:   Problem List Items Addressed This Visit    Chronic pain syndrome (Chronic)    Chronic pain secondary to back pain Lumbar DJD See A&P Currently seems improved with less stress, better sleep among other factors      Relevant Medications   metaxalone (SKELAXIN) 800 MG tablet   COPD (chronic obstructive pulmonary disease) (HCC)    Stable without worsening. 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 Improved on Robaxin. No longer on opiates. Failed Baclofen, Flexeril when switched per insurance - Repeat trial back on Skelaxin, pending ins approval      Relevant Medications   metaxalone (SKELAXIN) 800 MG tablet   GERD (gastroesophageal reflux disease)    Well controlled now back on H2 Zantac BID Improved with stress relief at home Not on PPI any more. Prior negative H Pylori Follow-up as needed      Relevant  Medications   ranitidine (ZANTAC) 150 MG tablet   Recurrent depression (HCC)    Dramatic improvement now with life stressor change, mother moved out of house. Self discontinued Paxil 3 months ago PHQ improved, more functional  Plan: 1. Remain off SSRI Paxil for now since improved clinically 2. Follow-up as needed if worsening recurrent stressors / depression      Right lateral epicondylitis    Likely etiology for R upper forearm pain, worse with lifting. Seems provoking injury with overuse with grandson  Plan: 1. Trial on forearm strap use below elbow instead of above, demo'd 2. Start Aleve OTC BID for 2-4 weeks PRN, Tylenol breakthrough 3. Activity modification, recommend rest 4. Follow-up if not improving consider x-ray, referral PT vs ortho      Relevant Medications   metaxalone (SKELAXIN) 800 MG tablet    Other Visit Diagnoses    Annual physical exam    -  Primary   Relevant Orders   COMPLETE METABOLIC PANEL WITH GFR (Completed)   Lipid panel (Completed)   CBC with Differential/Platelet (Completed)   Hemoglobin A1c   Screening for breast cancer       Abnormal weight gain       Relevant Orders   TSH (Completed)   Screening cholesterol level       Relevant Orders   Lipid panel (Completed)   Screening for diabetes mellitus  Relevant Orders   Hemoglobin A1c      Meds ordered this encounter  Medications  . metaxalone (SKELAXIN) 800 MG tablet    Sig: Take 0.5-1 tablets (400-800 mg total) by mouth 3 (three) times daily.    Dispense:  90 tablet    Refill:  3  . ranitidine (ZANTAC) 150 MG tablet    Sig: Take 1 tablet (150 mg total) by mouth 2 (two) times daily.    Dispense:  60 tablet    Refill:  5    Keep refills on file    Follow up plan: Return in about 6 months (around 01/22/2018) for GERD, Mood/Anxiety PHQ/GAD, R Tennis Elbow.  Saralyn Pilar, DO [facility] 07/25/2017, 12:23 AM

## 2017-07-25 ENCOUNTER — Encounter: Payer: Self-pay | Admitting: Family Medicine

## 2017-07-25 DIAGNOSIS — M7711 Lateral epicondylitis, right elbow: Secondary | ICD-10-CM | POA: Insufficient documentation

## 2017-07-25 LAB — COMPLETE METABOLIC PANEL WITH GFR
AG RATIO: 1.5 (calc) (ref 1.0–2.5)
ALBUMIN MSPROF: 4.4 g/dL (ref 3.6–5.1)
ALT: 24 U/L (ref 6–29)
AST: 18 U/L (ref 10–35)
Alkaline phosphatase (APISO): 92 U/L (ref 33–115)
BUN: 7 mg/dL (ref 7–25)
CALCIUM: 9.5 mg/dL (ref 8.6–10.2)
CO2: 27 mmol/L (ref 20–32)
CREATININE: 0.79 mg/dL (ref 0.50–1.10)
Chloride: 103 mmol/L (ref 98–110)
GFR, EST NON AFRICAN AMERICAN: 89 mL/min/{1.73_m2} (ref >=60)
GFR, Est African American: 103 mL/min/{1.73_m2} (ref >=60)
GLOBULIN: 2.9 g/dL (ref 1.9–3.7)
Glucose, Bld: 72 mg/dL (ref 65–139)
POTASSIUM: 4.1 mmol/L (ref 3.5–5.3)
SODIUM: 138 mmol/L (ref 135–146)
TOTAL PROTEIN: 7.3 g/dL (ref 6.1–8.1)
Total Bilirubin: 0.4 mg/dL (ref 0.2–1.2)

## 2017-07-25 LAB — CBC WITH DIFFERENTIAL/PLATELET
BASOS PCT: 0.4 %
Basophils Absolute: 29 cells/uL (ref 0–200)
Eosinophils Absolute: 101 cells/uL (ref 15–500)
Eosinophils Relative: 1.4 %
HCT: 44.1 % (ref 35.0–45.0)
Hemoglobin: 15.1 g/dL (ref 11.7–15.5)
Lymphs Abs: 2614 cells/uL (ref 850–3900)
MCH: 30.5 pg (ref 27.0–33.0)
MCHC: 34.2 g/dL (ref 32.0–36.0)
MCV: 89.1 fL (ref 80.0–100.0)
MPV: 11.8 fL (ref 7.5–12.5)
Monocytes Relative: 5.3 %
Neutro Abs: 4075 cells/uL (ref 1500–7800)
Neutrophils Relative %: 56.6 %
PLATELETS: 226 10*3/uL (ref 140–400)
RBC: 4.95 10*6/uL (ref 3.80–5.10)
RDW: 12.7 % (ref 11.0–15.0)
TOTAL LYMPHOCYTE: 36.3 %
WBC: 7.2 10*3/uL (ref 3.8–10.8)
WBCMIX: 382 {cells}/uL (ref 200–950)

## 2017-07-25 LAB — LIPID PANEL
CHOL/HDL RATIO: 3.8 (calc) (ref <5.0)
Cholesterol: 150 mg/dL (ref <200)
HDL: 40 mg/dL — AB (ref >50)
LDL Cholesterol (Calc): 89 mg/dL (calc)
NON-HDL CHOLESTEROL (CALC): 110 mg/dL (ref <130)
Triglycerides: 109 mg/dL (ref <150)

## 2017-07-25 LAB — HEMOGLOBIN A1C
HEMOGLOBIN A1C: 5.6 %{Hb} (ref <5.7)
Mean Plasma Glucose: 114 (calc)
eAG (mmol/L): 6.3 (calc)

## 2017-07-25 LAB — TSH: TSH: 1.04 mIU/L

## 2017-07-25 NOTE — Assessment & Plan Note (Signed)
Chronic pain secondary to back pain Lumbar DJD See A&P Currently seems improved with less stress, better sleep among other factors

## 2017-07-25 NOTE — Assessment & Plan Note (Signed)
Stable without worsening. Not requiring any maintenance therapy Chronic history of tobacco abuse Smoking cessation reviewed, not ready trying to gradually wean Follow-up, consider Albuterol PRN vs maintenance if worsening

## 2017-07-25 NOTE — Assessment & Plan Note (Signed)
Well controlled now back on H2 Zantac BID Improved with stress relief at home Not on PPI any more. Prior negative H Pylori Follow-up as needed

## 2017-07-25 NOTE — Assessment & Plan Note (Signed)
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 Improved on Robaxin. No longer on opiates. Failed Baclofen, Flexeril when switched per insurance - Repeat trial back on Skelaxin, pending ins approval

## 2017-07-25 NOTE — Assessment & Plan Note (Signed)
Dramatic improvement now with life stressor change, mother moved out of house. Self discontinued Paxil 3 months ago PHQ improved, more functional  Plan: 1. Remain off SSRI Paxil for now since improved clinically 2. Follow-up as needed if worsening recurrent stressors / depression

## 2017-07-25 NOTE — Assessment & Plan Note (Signed)
Likely etiology for R upper forearm pain, worse with lifting. Seems provoking injury with overuse with grandson  Plan: 1. Trial on forearm strap use below elbow instead of above, demo'd 2. Start Aleve OTC BID for 2-4 weeks PRN, Tylenol breakthrough 3. Activity modification, recommend rest 4. Follow-up if not improving consider x-ray, referral PT vs ortho

## 2017-07-26 ENCOUNTER — Encounter: Payer: Self-pay | Admitting: Family Medicine

## 2017-07-26 DIAGNOSIS — R7309 Other abnormal glucose: Secondary | ICD-10-CM | POA: Insufficient documentation

## 2017-09-28 ENCOUNTER — Telehealth: Payer: Self-pay

## 2017-09-28 DIAGNOSIS — K219 Gastro-esophageal reflux disease without esophagitis: Secondary | ICD-10-CM

## 2017-09-28 MED ORDER — RANITIDINE HCL 150 MG PO TABS: 150.0000 mg | ORAL_TABLET | Freq: Two times a day (BID) | ORAL | 5 refills | Status: DC

## 2017-09-28 NOTE — Telephone Encounter (Signed)
Patient wants Rx for Zantac 150 mg x4 daily now she has insurance can cover by her insurance. Please suggest?

## 2017-09-28 NOTE — Telephone Encounter (Signed)
Does not seem correct, last visit we discussed controlled on Zantac 150mg  twice daily - resent rx for 150, twice daily, monthly supply we can send 90 day supply if request  Saralyn PilarAlexander Karamalegos, DO [facility] 09/28/2017, 5:50 PM

## 2017-09-28 NOTE — Addendum Note (Signed)
Addended by: Smitty CordsKARAMALEGOS, ALEXANDER J on: 09/28/2017 05:50 PM   Modules accepted: Orders

## 2017-09-29 ENCOUNTER — Emergency Department: Payer: Self-pay

## 2017-09-29 ENCOUNTER — Emergency Department
Admission: EM | Admit: 2017-09-29 | Discharge: 2017-09-29 | Disposition: A | Discharge status: Home / Self Care | Payer: Self-pay | Attending: Emergency Medicine | Admitting: Emergency Medicine

## 2017-09-29 ENCOUNTER — Other Ambulatory Visit: Payer: Self-pay

## 2017-09-29 DIAGNOSIS — F17210 Nicotine dependence, cigarettes, uncomplicated: Secondary | ICD-10-CM | POA: Insufficient documentation

## 2017-09-29 DIAGNOSIS — Z79899 Other long term (current) drug therapy: Secondary | ICD-10-CM | POA: Insufficient documentation

## 2017-09-29 DIAGNOSIS — R1011 Right upper quadrant pain: Secondary | ICD-10-CM

## 2017-09-29 DIAGNOSIS — J449 Chronic obstructive pulmonary disease, unspecified: Secondary | ICD-10-CM | POA: Insufficient documentation

## 2017-09-29 DIAGNOSIS — K219 Gastro-esophageal reflux disease without esophagitis: Secondary | ICD-10-CM | POA: Insufficient documentation

## 2017-09-29 DIAGNOSIS — R1013 Epigastric pain: Secondary | ICD-10-CM | POA: Insufficient documentation

## 2017-09-29 LAB — COMPREHENSIVE METABOLIC PANEL
ALT: 25 U/L (ref 14–54)
ANION GAP: 8 (ref 5–15)
AST: 22 U/L (ref 15–41)
Albumin: 4.4 g/dL (ref 3.5–5.0)
Alkaline Phosphatase: 90 U/L (ref 38–126)
BILIRUBIN TOTAL: 0.5 mg/dL (ref 0.3–1.2)
BUN: 8 mg/dL (ref 6–20)
CO2: 26 mmol/L (ref 22–32)
Calcium: 9.5 mg/dL (ref 8.9–10.3)
Chloride: 103 mmol/L (ref 101–111)
Creatinine, Ser: 0.88 mg/dL (ref 0.44–1.00)
Glucose, Bld: 121 mg/dL — ABNORMAL HIGH (ref 65–99)
POTASSIUM: 3.6 mmol/L (ref 3.5–5.1)
Sodium: 137 mmol/L (ref 135–145)
TOTAL PROTEIN: 7.6 g/dL (ref 6.5–8.1)

## 2017-09-29 LAB — CBC
HEMATOCRIT: 42.3 % (ref 35.0–47.0)
HEMOGLOBIN: 14.5 g/dL (ref 12.0–16.0)
MCH: 31.3 pg (ref 26.0–34.0)
MCHC: 34.2 g/dL (ref 32.0–36.0)
MCV: 91.4 fL (ref 80.0–100.0)
Platelets: 186 10*3/uL (ref 150–440)
RBC: 4.62 MIL/uL (ref 3.80–5.20)
RDW: 13 % (ref 11.5–14.5)
WBC: 6.1 10*3/uL (ref 3.6–11.0)

## 2017-09-29 LAB — LIPASE, BLOOD: LIPASE: 25 U/L (ref 11–51)

## 2017-09-29 LAB — TROPONIN I: Troponin I: <0.03 ng/mL (ref <0.03)

## 2017-09-29 MED ORDER — RANITIDINE HCL 150 MG PO TABS: ORAL_TABLET | ORAL | 0 refills | Status: DC

## 2017-09-29 NOTE — ED Triage Notes (Signed)
PT reports burning and pain in epigastric region, states she has hx of GERD but states meds are not working.   Pt states she has tried to drink pop to make herself burp but state she isn't unable to belch.  Ranitidine BID.  Pt states her PCP was supposed to give her protonix but PCP never called in new RX.

## 2017-09-29 NOTE — ED Provider Notes (Addendum)
[facility] Emergency Department Provider Note ____________________________________________   First MD Initiated Contact with Patient 09/29/17 1859     (approximate)  I have reviewed the triage vital signs and the nursing notes.   HISTORY  Chief Complaint Abdominal Pain    HPI Denise Estrada is a 47 y.o. female with past medical history of GERD and other PMH as noted below who presents with epigastric and supraumbilical abdominal pain which is identical to pain she is chronically had from GERD in the past.  She reports that the pain has been worsened over the last few weeks, and is worse after meals.  She denies associated nausea, vomiting, diarrhea, or fever.  The patient states that she was previously on ranitidine 300 mg twice daily and that this would control her symptoms, but her doctor brought her down to 150 mg twice daily several months ago and then when her symptoms flared up she wanted to go back on the previous dose but her doctor called in a prescription only for the dose she is currently on.  The patient states that she most recently had over one year ago.  Past Medical History:  Diagnosis Date  . Depression   . GERD (gastroesophageal reflux disease)   . Hyperlipidemia   . Ulcer of esophagus     Patient Active Problem List   Diagnosis Date Noted  . Elevated hemoglobin A1c 07/26/2017  . Right lateral epicondylitis 07/25/2017  . Chronic pain syndrome 01/08/2017  . DJD (degenerative joint disease) of cervical spine 06/19/2016  . 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 Perimenopausal 06/19/2016  . Anxiety 09/09/2015  . Chronic shoulder pain (Left) 09/09/2015  . COPD (chronic obstructive pulmonary disease) (HCC) 09/09/2015  . Tobacco abuse 09/09/2015  . Fibrocystic breast disease 09/09/2015  . GERD (gastroesophageal reflux disease) 09/09/2015  . Herniated nucleus pulposus, L5-S1, right 09/09/2015  . Hyperlipidemia  09/09/2015  . Migraine 09/09/2015  . Recurrent depression (HCC) 09/09/2015     Prior to Admission medications   Medication Sig Start Date End Date Taking? Authorizing Provider  metaxalone (SKELAXIN) 800 MG tablet Take 0.5-1 tablets (400-800 mg total) by mouth 3 (three) times daily. 07/24/17   Karamalegos, Netta Neat, DO  ranitidine (ZANTAC) 150 MG tablet Take 1 tablet (150 mg total) by mouth 2 (two) times daily. 09/28/17   Karamalegos, Netta Neat, DO    Allergies Erythromycin base; Lansoprazole; Sulfamethoxazole-trimethoprim; and Tramadol  Family History  Problem Relation Age of Onset  . Heart disease Mother   . Stroke Mother   . Diabetes Mother   . Depression Mother   . Cancer Father        lung  . Heart disease Father   . Stroke Father   . Diabetes Father   . Lung cancer Father   . Heart attack Brother   . Diabetes Brother   . Hypertension Brother   . Diabetes Son        type 1  . Cancer Maternal Grandmother   . Hypertension Maternal Grandmother   . Breast cancer Maternal Grandmother        unsure late 55's?  . Stroke Maternal Grandfather   . Heart attack Maternal Grandfather   . Diabetes Maternal Grandfather   . Diabetes Paternal Grandmother     Social History Social History   Tobacco Use  . Smoking status: Current Every Day Smoker    Packs/day: 0.50    Years: 20.00    Pack years:  10.00    Types: Cigarettes  . Smokeless tobacco: Current User  . Tobacco comment: 3/4 pack for most of 20 years  Substance Use Topics  . Alcohol use: No  . Drug use: No    Review of Systems  Constitutional: No fever. Eyes: No redness. ENT: No sore throat. Cardiovascular: Denies chest pain. Respiratory: Denies shortness of breath. Gastrointestinal: No nausea, no vomiting.  No diarrhea.  Genitourinary: Negative for dysuria.  Musculoskeletal: Negative for back pain. Skin: Negative for rash. Neurological: Negative for  headache.   ____________________________________________   PHYSICAL EXAM:  VITAL SIGNS: ED Triage Vitals  Enc Vitals Group     BP 09/29/17 1705 (!) 135/95     Pulse Rate 09/29/17 1705 67     Resp 09/29/17 1705 16     Temp 09/29/17 1705 98.5 F (36.9 C)     Temp Source 09/29/17 1705 Oral     SpO2 09/29/17 1705 99 %     Weight 09/29/17 1705 154 lb (69.9 kg)     Height 09/29/17 1705 5\' 5"  (1.651 m)     Head Circumference --      Peak Flow --      Pain Score 09/29/17 1708 5     Pain Loc --      Pain Edu? --      Excl. in GC? --     Constitutional: Alert and oriented. Well appearing and in no acute distress. Eyes: Conjunctivae are normal.  No scleral icterus Head: Atraumatic. Nose: No congestion/rhinnorhea. Mouth/Throat: Mucous membranes are moist.   Neck: Normal range of motion.  Cardiovascular:   Good peripheral circulation. Respiratory: Normal respiratory effort.  Gastrointestinal: Soft with mild supraumbilical and epigastric tenderness.  Mild right upper quadrant tenderness.. No distention.  Genitourinary: No CVA tenderness. Musculoskeletal: No lower extremity edema.  Extremities warm and well perfused.  Neurologic:  Normal speech and language. No gross focal neurologic deficits are appreciated.  Skin:  Skin is warm and dry. No rash noted. Psychiatric: Mood and affect are normal. Speech and behavior are normal.  ____________________________________________   LABS (all labs ordered are listed, but only abnormal results are displayed)  Labs Reviewed  COMPREHENSIVE METABOLIC PANEL - Abnormal; Notable for the following components:      Result Value   Glucose, Bld 121 (*)    All other components within normal limits  LIPASE, BLOOD  CBC  TROPONIN I  URINALYSIS, COMPLETE (UACMP) WITH MICROSCOPIC  POC URINE PREG, ED   ____________________________________________  EKG   ____________________________________________  RADIOLOGY  US RUQ: No cholelithiasis or  evidence of acute cholecystitis  ____________________________________________   PROCEDURES  Procedure(s) performed: No    Critical Care performed: No ____________________________________________   INITIAL IMPRESSION / ASSESSMENT AND PLAN / ED COURSE  Pertinent labs & imaging results that were available during my care of the patient were reviewed by me and considered in my medical decision making (see chart for details).  47 year old female with past medical history of GERD and other PMH as noted above presents with recurrent abdominal pain similar to her prior GERD over the last several weeks, primarily in the supra umbilical region.  She denies any fevers, vomiting, weakness, or other acute symptoms.  She states she previously was on a higher dose of ranitidine and it was decreased several months ago.  Past medical records reviewed in Epic and are noncontributory; the patient has no recent right upper quadrant sono available to me in Epic or Care Everywhere.  On exam,  the patient is extremely well-appearing, vital signs are normal, and the abdomen is mildly tender as described.  Differential is primarily GERD versus gastritis, but given the right upper quadrant component of the pain I also am considering biliary colic although I have a low suspicion for acute cholecystitis.  Will obtain labs and right upper quadrant ultrasound.  If negative anticipate discharge home with either higher dose of ranitidine or an alternative agent.     ----------------------------------------- 8:39 PM on 09/29/2017 -----------------------------------------  Right upper quadrant sono is negative.  I will prescribe for a short course of increased dose of ranitidine although I noted to the patient that long-term use of this elevated dose is not recommended.  Although typically the ranitidine is given 150 mg twice daily or 300 mg once, the patient reports being on 300 mg twice daily in the past and this  being effective.  Based on discussion with her, we will give an increased dose with 300 mg at night as well as the 150 mg in the morning for short course of 2 weeks.  Given that the patient has long-standing GERD and has not seen a gastroenterologist, we will make a referral to GI here so that she can get better long-term care.  Patient understands the plan and feels well to go home.  She expressed understanding of the return precautions. ____________________________________________   FINAL CLINICAL IMPRESSION(S) / ED DIAGNOSES  Final diagnoses:  Right upper quadrant abdominal pain  Gastroesophageal reflux disease without esophagitis      NEW MEDICATIONS STARTED DURING THIS VISIT:  This SmartLink is deprecated. Use AVSMEDLIST instead to display the medication list for a patient.   Note:  This document was prepared using Dragon voice recognition software and may include unintentional dictation errors.      Dionne Bucy, MD 09/29/17 2044

## 2017-09-29 NOTE — ED Triage Notes (Signed)
FIRST NURSE NOTE-reports reflux is acting up and home medicine not working. Unlabored. NAD

## 2017-09-29 NOTE — Discharge Instructions (Signed)
Take the new dose regimen of the ranitidine as prescribed.  You should follow-up with a gastroenterologist within the next 2 weeks, and return to the emergency department for new or worsening pain, vomiting, fevers, dark stools or blood in the stool, weakness or lightheadedness, chest pain, or any other new or worsening symptoms that concern you.

## 2017-09-29 NOTE — ED Notes (Signed)
Patient transported to Ultrasound 

## 2017-10-01 ENCOUNTER — Telehealth: Payer: Self-pay | Admitting: Family Medicine

## 2017-10-01 NOTE — Telephone Encounter (Signed)
I am okay with her temporarily increasing dose to Zantac 1 tab in AM and 2 PM, and if absolutely needed may go to 2 tabs in AM and 2 tabs in PM for max of 2 weeks, then back down to 1 tab BID. If that still does not work we may need to extend her course of 2 tabs BID.  Let me know if need new rx for 2 pills twice daily at this time.  There are limited options for me to do in primary care if she cannot follow-up with GI. We may need to consider a referral to a larger center that has a charity care option such as [facility].  Saralyn PilarAlexander Karamalegos, DO [facility] 10/01/2017, 1:21 PM

## 2017-10-01 NOTE — Telephone Encounter (Signed)
The pt called requesting your advice on taking  Zantac 150MG   (1)one tablet in the morning and (2) two tablets at night. She was seen in the ER this weekend for gastric pain and was told to increase the medication, but was told she can only take it like that short term. They also referred her to  gastroenterologist, but she is unable to f/u with them due to the lack of insurance. She states the Zantac  150 MG bid is no longer controlling her acid reflux.

## 2017-10-01 NOTE — Telephone Encounter (Signed)
Pt asked for a call back to clarify lab and US results from ER visit on Saturday [phone]

## 2017-10-02 NOTE — Telephone Encounter (Signed)
Pt advised.

## 2017-10-03 ENCOUNTER — Telehealth: Payer: Self-pay | Admitting: Family Medicine

## 2017-10-03 NOTE — Telephone Encounter (Signed)
Pt asked for a call back about test results on My Chart [phone]

## 2017-10-04 NOTE — Telephone Encounter (Signed)
Send mychart message

## 2018-01-22 ENCOUNTER — Ambulatory Visit: Payer: Medicaid Other | Admitting: Family Medicine

## 2018-03-11 ENCOUNTER — Other Ambulatory Visit: Payer: Self-pay | Admitting: Family Medicine

## 2018-03-11 DIAGNOSIS — Z1231 Encounter for screening mammogram for malignant neoplasm of breast: Secondary | ICD-10-CM

## 2018-03-14 ENCOUNTER — Encounter: Payer: Self-pay | Admitting: Family Medicine

## 2018-04-03 ENCOUNTER — Ambulatory Visit
Admission: RE | Admit: 2018-04-03 | Discharge: 2018-04-03 | Disposition: A | Discharge status: Home / Self Care | Payer: Medicaid Other | Source: Ambulatory Visit | Attending: Family Medicine | Admitting: Family Medicine

## 2018-04-03 DIAGNOSIS — Z1231 Encounter for screening mammogram for malignant neoplasm of breast: Secondary | ICD-10-CM

## 2018-04-18 ENCOUNTER — Encounter: Payer: Medicaid Other | Admitting: Family Medicine

## 2018-05-01 ENCOUNTER — Other Ambulatory Visit: Payer: Self-pay | Admitting: Rehabilitation

## 2018-05-01 DIAGNOSIS — M50321 Other cervical disc degeneration at C4-C5 level: Secondary | ICD-10-CM

## 2018-05-20 ENCOUNTER — Encounter: Payer: Self-pay | Admitting: Family Medicine

## 2018-05-20 ENCOUNTER — Ambulatory Visit (INDEPENDENT_AMBULATORY_CARE_PROVIDER_SITE_OTHER): Payer: Medicaid Other | Admitting: Family Medicine

## 2018-05-20 VITALS — BP 112/77 | HR 68 | Temp 98.7°F | Resp 16 | Ht 64.5 in | Wt 159.0 lb

## 2018-05-20 DIAGNOSIS — G894 Chronic pain syndrome: Secondary | ICD-10-CM

## 2018-05-20 DIAGNOSIS — F3340 Major depressive disorder, recurrent, in remission, unspecified: Secondary | ICD-10-CM | POA: Diagnosis not present

## 2018-05-20 DIAGNOSIS — Z720 Tobacco use: Secondary | ICD-10-CM

## 2018-05-20 DIAGNOSIS — R7309 Other abnormal glucose: Secondary | ICD-10-CM | POA: Diagnosis not present

## 2018-05-20 DIAGNOSIS — E782 Mixed hyperlipidemia: Secondary | ICD-10-CM | POA: Diagnosis not present

## 2018-05-20 DIAGNOSIS — Z0000 Encounter for general adult medical examination without abnormal findings: Secondary | ICD-10-CM

## 2018-05-20 DIAGNOSIS — M4726 Other spondylosis with radiculopathy, lumbar region: Secondary | ICD-10-CM

## 2018-05-20 DIAGNOSIS — J432 Centrilobular emphysema: Secondary | ICD-10-CM | POA: Diagnosis not present

## 2018-05-20 DIAGNOSIS — N951 Menopausal and female climacteric states: Secondary | ICD-10-CM

## 2018-05-20 DIAGNOSIS — K219 Gastro-esophageal reflux disease without esophagitis: Secondary | ICD-10-CM

## 2018-05-20 MED ORDER — PANTOPRAZOLE SODIUM 20 MG PO TBEC: 20.0000 mg | DELAYED_RELEASE_TABLET | Freq: Every day | ORAL | 2 refills | Status: DC

## 2018-05-20 MED ORDER — METAXALONE 800 MG PO TABS: 400.0000 mg | ORAL_TABLET | Freq: Three times a day (TID) | ORAL | 3 refills | Status: DC

## 2018-05-20 NOTE — Assessment & Plan Note (Signed)
Refractory symptoms to max dose H2 blocker Previously controlled on PPI Not seen GI Prior negative H Pylori breath 2 yr ago  Plan DC H2 blocker Start back on PPI - Pantoprazole 20mg  daily before breakfast, 2-4 weeks if not effective, can consider double dose 40mg  vs split dosing 20mg  BID, also may consider PPI in AM and H2 blocker ranitidine 150-300mg  in PM Follow-up may consider GI referral in future

## 2018-05-20 NOTE — Assessment & Plan Note (Addendum)
Still with irregular menstrual bleeding and hot flash symptoms by report Off SSRI Paxil >1 year now may have been helping some of her hot flash symptoms Future if not improve reconsider options, herbal black cohosh or resume SSRI or gabapentin

## 2018-05-20 NOTE — Assessment & Plan Note (Signed)
Stable, remains in remission Chronic depression and anxiety mixed, previous life stressors living situation was affecting this in past Off meds - Self discontinued Paxil SSRI  Plan Follow-up in future if recurrence May reconsider SSRI vs SNRI for chronic pain or perimenopausal symptoms if needed

## 2018-05-20 NOTE — Patient Instructions (Addendum)
Thank you for coming to the office today.  Labs today stay tuned for results.  Symptoms of hot flashes most likely peri-menopausal  Refilled Skelaxin as needed.  For Stomach acid  Starting tomorrow before breakfast take Pantoprazole (Protonix) 20mg  daily. Prefer to take this med about 30 min before breakfast or 1st meal of day for 4 weeks, don't stop taking unless we discuss first. Probably will need longer term or up to 8 to 12 weeks  If not effective after first 2-4 weeks, you may ADD Ranitidine 300mg  in evening before dinner.  OR we can consider double Pantoprazole to morning and evening dosing. OR in future just double morning dose Pantoprazole Future may need GI referral  DIET RECOMMENDATIONS - Avoid spicy, greasy, fried foods, also things like caffeine, dark chocolate, peppermint can worsen - Avoid large meals and late night snacks, also do not go more than 4-5 hours without a snack or meal (not eating will worsen reflux symptoms due to stomach acid) - You may also elevate the head of your bed at night to sleep at very slight incline to help reduce symptoms  If the problem improves but keeps coming back, we can discuss higher dose or longer course at next visit.  If symptoms are worsening or persistent despite treatment or develop any different severe esophagus or abdominal pain, unable to swallow solids or liquids, nausea, vomiting especially blood in vomit, fever/chills, or unintentional weight loss / no appetite, please follow-up sooner in office or seek more immediate medical attention at hospital Emergency Department.  Regarding other medicines:  - STOP taking Ibuprofen, Advil, Motrin, Goody's / BC powder - DO NOT take without discussing with your doctor. These medicines can put you at high risk for future bleeding.  If need pain medicine, may take Tylenol Extra Strength (Acetaminophen) 500mg  tabs - take 1 to 2 tabs per dose (max 1000mg ) every 6-8 hours for pain (take  regularly, don't skip a dose for next 7 days), max 24 hour daily dose is 6 tablets or 3000mg . In the future you can repeat the same everyday Tylenol course for 1-2 weeks at a time.   Please schedule a Follow-up Appointment to: Return in about 6 months (around 11/20/2018) for GERD, Back MSK pain, med refills.  If you have any other questions or concerns, please feel free to call the office or send a message through MyChart. You may also schedule an earlier appointment if necessary.  Additionally, you may be receiving a survey about your experience at our office within a few days to 1 week by e-mail or mail. We value your feedback.  Saralyn PilarAlexander Azhane Eckart, DO [facility]

## 2018-05-20 NOTE — Progress Notes (Signed)
Subjective:    Patient ID: Denise Estrada, female    DOB: [birth year], 47 y.o.   MRN: [id number]  Denise Estrada is a 47 y.o. female presenting on 05/20/2018 for Annual Exam   HPI  Here for Annual Exam and due for fasting labs (did have coffee with sugar free creamer)  FOLLOW-UP Major Depression in remission / Anxiety: Chronic problems, have been well controlled, and she has self discontinued previous med with Paxil back in 2018. She has improved with less life stressors impacting her, see prior note for background information. - Today patient reports doing very well - Still Improved sleep, now without insomnia  GERD Chronic problem. Prior visit 07/2016 had H pylori testing negative. She was improved in past on PPI pantoprazole and then had to switch to H2 blocker higher dose Ranitidine in past after off PPI due to insurance and cost. She did well with Ranitidine 300mg  1-2 times a day with good results but concern high dose. 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 Failed other muscle relaxant in past including flexeril, baclofen, only effective has been Skelaxin. She reviewed her chronic pains mostly of Left mid back radiating across her side and shoulder, multiple old shoulder injuries, has done PT among other interventions. - Today due for refill of Skelaxin, has used with good results PRN flares - Taking some Tylenol  Overweight BMI >26 / Elevated A1c Last A1c 5.6 in 06/2017 Lifestyle - Weight gain and fluctuations - Diet: erratic meals not always eating breakfast or lunch sometimes skipping meals - Exercise: reduced exercise and activity, admits less walking now due to heat  HYPERLIPIDEMIA: Due for repeat lipids, here for lab test today. She is not taking Fish Oil wants to know if she should resume it. She  used to be on statin in past Crestor then came off. - Not taking ASA daily, due to history of GERD  TOBACCO ABUSE / COPD - Active smoker, using e cigs, cut down to < 0.5pp still but not able to quit. Feels like she may be ready in near future, but wants to slowly wean down on her own. - No problems with COPD, not on any inhalers has never had significant flare up or hospitalization  Additionally - Admits perimenopausal hot flash symptoms  Health Maintenance: - History of abnormal pap smear 1997 (abnormal cells, s/p cervical biopsy, removing large portion), again around 2010/2011 had abnormal cells and dx with HPV. Last pap smear 2016, normal and negative HPV. Next due 2019-2021 - today she requests to proceed with pap this year in 2019, she prefer female provider, requests to be scheduled with Wilhelmina Mcardle, AGPCNP-BC. Also offered GYN referral she declined  Breast CA Screening: Last mammogram result negative bi rads 1 3D result (04/03/18) done at [facility]. Prior history abnormal mammogram with breast biopsy 2010 (negative). Known family history of breast cancer maternal grandmother breast cancer age >32-60. Currently asymptomatic.  - Declines Flu shot, offered again, counseled on risks and benefits   Depression screen [facility] 2/9 05/20/2018 07/24/2017 08/24/2016  Decreased Interest 0 0 0  Down, Depressed, Hopeless 1 0 0  PHQ - 2 Score 1 0 0  Altered sleeping 0 0 0  Tired, decreased energy 1 0 2  Change in appetite 0 0 0  Feeling bad or  failure about yourself  0 0 0  Trouble concentrating 0 0 0  Moving slowly or fidgety/restless 0 0 0  Suicidal thoughts 0 0 0  PHQ-9 Score 2 0 2  Difficult doing work/chores Somewhat difficult Not difficult at all Not difficult at all   GAD 7 : Generalized Anxiety Score 05/20/2018  Nervous, Anxious, on Edge 0  Control/stop worrying 1  Worry too much - different things 1  Trouble relaxing 0  Restless 0  Easily annoyed or irritable 1  Afraid -  awful might happen 0  Total GAD 7 Score 3  Anxiety Difficulty Not difficult at all    Past Medical History:  Diagnosis Date  . Depression   . GERD (gastroesophageal reflux disease)   . Hyperlipidemia   . Ulcer of esophagus    Past Surgical History:  Procedure Laterality Date  . biopsy cervix  1996/1997  . BREAST BIOPSY Right 2007   neg  . BREAST SURGERY     biopsy   Social History   Socioeconomic History  . Marital status: Married    Spouse name: Not on file  . Number of children: Not on file  . Years of education: Not on file  . Highest education level: Not on file  Occupational History  . Not on file  Social Needs  . Financial resource strain: Not on file  . Food insecurity:    Worry: Not on file    Inability: Not on file  . Transportation needs:    Medical: Not on file    Non-medical: Not on file  Tobacco Use  . Smoking status: Current Every Day Smoker    Packs/day: 0.50    Years: 20.00    Pack years: 10.00    Types: Cigarettes  . Smokeless tobacco: Current User  . Tobacco comment: 3/4 pack for most of 20 years  Substance and Sexual Activity  . Alcohol use: No  . Drug use: No  . Sexual activity: Yes    Birth control/protection: None  Lifestyle  . Physical activity:    Days per week: Not on file    Minutes per session: Not on file  . Stress: Not on file  Relationships  . Social connections:    Talks on phone: Not on file    Gets together: Not on file    Attends religious service: Not on file    Active member of club or organization: Not on file    Attends meetings of clubs or organizations: Not on file    Relationship status: Not on file  . Intimate partner violence:    Fear of current or ex partner: Not on file    Emotionally abused: Not on file    Physically abused: Not on file    Forced sexual activity: Not on file  Other Topics Concern  . Not on file  Social History Narrative  . Not on file   Family History  Problem Relation Age of  Onset  . Heart disease Mother   . Stroke Mother   . Diabetes Mother   . Depression Mother   . Cancer Father        lung  . Heart disease Father   . Stroke Father   . Diabetes Father   . Lung cancer Father   . Heart attack Brother   . Diabetes Brother   . Hypertension Brother   . Diabetes Son        type 1  . Cancer Maternal Grandmother   .  Hypertension Maternal Grandmother   . Breast cancer Maternal Grandmother        unsure late 30's?  . Stroke Maternal Grandfather   . Heart attack Maternal Grandfather   . Diabetes Maternal Grandfather   . Diabetes Paternal Grandmother    No current outpatient medications on file prior to visit.   No current facility-administered medications on file prior to visit.     Review of Systems  Constitutional: Negative for activity change, appetite change, chills, diaphoresis, fatigue and fever.  HENT: Negative for congestion, hearing loss and sinus pressure.   Eyes: Negative for visual disturbance.  Respiratory: Negative for apnea, cough, chest tightness, shortness of breath and wheezing.   Cardiovascular: Negative for chest pain, palpitations and leg swelling.  Gastrointestinal: Negative for abdominal pain, anal bleeding, blood in stool, constipation, diarrhea, nausea and vomiting.       GERD / heartburn  Endocrine: Negative for cold intolerance.  Genitourinary: Negative for difficulty urinating, dysuria, frequency, hematuria and urgency.  Musculoskeletal: Positive for arthralgias and back pain. Negative for neck pain.  Skin: Negative for rash.  Allergic/Immunologic: Negative for environmental allergies.  Neurological: Negative for dizziness, weakness, light-headedness, numbness and headaches.  Hematological: Negative for adenopathy.  Psychiatric/Behavioral: Negative for behavioral problems, dysphoric mood and sleep disturbance. The patient is not nervous/anxious.    Per HPI unless specifically indicated above     Objective:    BP  112/77   Pulse 68   Temp 98.7 F (37.1 C) (Oral)   Resp 16   Ht 5' 4.5" (1.638 m)   Wt 159 lb (72.1 kg)   BMI 26.87 kg/m   Wt Readings from Last 3 Encounters:  05/20/18 159 lb (72.1 kg)  09/29/17 154 lb (69.9 kg)  07/24/17 158 lb (71.7 kg)    Physical Exam  Constitutional: She is oriented to person, place, and time. She appears well-developed and well-nourished. No distress.  Well-appearing, comfortable, cooperative  HENT:  Head: Normocephalic and atraumatic.  Mouth/Throat: Oropharynx is clear and moist.  Frontal / maxillary sinuses non-tender. Nares patent without purulence or edema. Bilateral TMs clear without erythema, effusion or bulging. Oropharynx clear without erythema, exudates, edema or asymmetry.  Eyes: Pupils are equal, round, and reactive to light. Conjunctivae and EOM are normal. Right eye exhibits no discharge. Left eye exhibits no discharge.  Neck: Normal range of motion. Neck supple. No thyromegaly present.  Cardiovascular: Normal rate, regular rhythm, normal heart sounds and intact distal pulses.  No murmur heard. Pulmonary/Chest: Effort normal and breath sounds normal. No respiratory distress. She has no wheezes. She has no rales.  Abdominal: Soft. Bowel sounds are normal. She exhibits no distension and no mass. There is no tenderness.  Musculoskeletal: Normal range of motion. She exhibits no edema or tenderness.  Upper / Lower Extremities: - Normal muscle tone, strength bilateral upper extremities 5/5, lower extremities 5/5  Left mid/low back Localized muscle hypertonicity and spasm of paraspinal muscles mild tender to deeper palpation, has intact range of motion of L shoulder  Lymphadenopathy:    She has no cervical adenopathy.  Neurological: She is alert and oriented to person, place, and time.  Distal sensation intact to light touch all extremities  Skin: Skin is warm and dry. No rash noted. She is not diaphoretic. No erythema.  Psychiatric: She has a  normal mood and affect. Her behavior is normal.  Well groomed, good eye contact, normal speech and thoughts  Nursing note and vitals reviewed.  Results for orders placed or performed  during the hospital encounter of 09/29/17  Lipase, blood  Result Value Ref Range   Lipase 25 11 - 51 U/L  Comprehensive metabolic panel  Result Value Ref Range   Sodium 137 135 - 145 mmol/L   Potassium 3.6 3.5 - 5.1 mmol/L   Chloride 103 101 - 111 mmol/L   CO2 26 22 - 32 mmol/L   Glucose, Bld 121 (H) 65 - 99 mg/dL   BUN 8 6 - 20 mg/dL   Creatinine, Ser 1.61 0.44 - 1.00 mg/dL   Calcium 9.5 8.9 - 09.6 mg/dL   Total Protein 7.6 6.5 - 8.1 g/dL   Albumin 4.4 3.5 - 5.0 g/dL   AST 22 15 - 41 U/L   ALT 25 14 - 54 U/L   Alkaline Phosphatase 90 38 - 126 U/L   Total Bilirubin 0.5 0.3 - 1.2 mg/dL   GFR calc non Af Amer >60 >60 mL/min   GFR calc Af Amer >60 >60 mL/min   Anion gap 8 5 - 15  CBC  Result Value Ref Range   WBC 6.1 3.6 - 11.0 K/uL   RBC 4.62 3.80 - 5.20 MIL/uL   Hemoglobin 14.5 12.0 - 16.0 g/dL   HCT 04.5 40.9 - 81.1 %   MCV 91.4 80.0 - 100.0 fL   MCH 31.3 26.0 - 34.0 pg   MCHC 34.2 32.0 - 36.0 g/dL   RDW 91.4 78.2 - 95.6 %   Platelets 186 150 - 440 K/uL  Troponin I  Result Value Ref Range   Troponin I <0.03 <0.03 ng/mL      Assessment & Plan:   Problem List Items Addressed This Visit    Chronic pain syndrome (Chronic)    Secondary to OA/DJD lumbar spine among other areas, L shoulder/neck - See A&P Refill Skelaxin Followup      Relevant Medications   metaxalone (SKELAXIN) 800 MG tablet   COPD (chronic obstructive pulmonary disease) (HCC)    Stable without flare up Not requiring any maintenance therapy Chronic history of tobacco abuse Smoking cessation reviewed, not ready still trying to gradually wean Follow-up, consider Albuterol PRN vs maintenance if worsening      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 Due for labs and repeat A1c today 2. Encourage improved lifestyle - low carb, low sugar diet, reduce portion size, continue improving regular exercise Follow-up as planned q 6-12 months pending A1c trend       Relevant Orders   Hemoglobin A1c   GERD (gastroesophageal reflux disease)    Refractory symptoms to max dose H2 blocker Previously controlled on PPI Not seen GI Prior negative H Pylori breath 2 yr ago  Plan DC H2 blocker Start back on PPI - Pantoprazole 20mg  daily before breakfast, 2-4 weeks if not effective, can consider double dose 40mg  vs split dosing 20mg  BID, also may consider PPI in AM and H2 blocker ranitidine 150-300mg  in PM Follow-up may consider GI referral in future      Relevant Medications   pantoprazole (  PROTONIX) 20 MG tablet   Hyperlipidemia    Previously controlled lipids on lifestyle off statin Will check fasting lipid today and follow-up result Encourage maintain healthy lifestyle, diet exercise - likely will recommend resume fish oil OTC if indicated      Relevant Orders   Lipid panel   TSH   T4, free   Perimenopausal    Still with irregular menstrual bleeding and hot flash symptoms by report Off SSRI Paxil >1 year now may have been helping some of her hot flash symptoms Future if not improve reconsider options, herbal black cohosh or resume SSRI or gabapentin      Recurrent depressive disorder, currently in remission (HCC)    Stable, remains in remission Chronic depression and anxiety mixed, previous life stressors living situation was  affecting this in past Off meds - Self discontinued Paxil SSRI  Plan Follow-up in future if recurrence May reconsider SSRI vs SNRI for chronic pain or perimenopausal symptoms if needed      Relevant Orders   COMPLETE METABOLIC PANEL WITH GFR   Tobacco abuse    Active smoker, continues to wean down on E-cigs Not ready to quit Follow-up if ready to try med rx or other option       Other Visit Diagnoses    Annual physical exam    -  Primary   Relevant Orders   Hemoglobin A1c   CBC with Differential/Platelet   COMPLETE METABOLIC PANEL WITH GFR   Lipid panel    Updated Health Maintenance information Reviewed recent lab results with patient Encouraged improvement to lifestyle with diet and exercise - Goal of weight loss     Meds ordered this encounter  Medications  . pantoprazole (PROTONIX) 20 MG tablet    Sig: Take 1 tablet (20 mg total) by mouth daily before breakfast.    Dispense:  30 tablet    Refill:  2  . metaxalone (SKELAXIN) 800 MG tablet    Sig: Take 0.5-1 tablets (400-800 mg total) by mouth 3 (three) times daily.    Dispense:  90 tablet    Refill:  3    Follow up plan: Return in about 6 months (around 11/20/2018) for GERD, Back MSK pain, med refills.  Saralyn PilarAlexander Karamalegos, DO [facility] 05/20/2018, 1:21 PM

## 2018-05-20 NOTE — Assessment & Plan Note (Signed)
Secondary to OA/DJD lumbar spine among other areas, L shoulder/neck - See A&P Refill Skelaxin Followup

## 2018-05-20 NOTE — Assessment & Plan Note (Signed)
Previously mostly controlled on lifestyle, slightly elevated A1c near Pre-DM range 5.5 to 5.6  Plan:  1. Due for labs and repeat A1c today 2. Encourage improved lifestyle - low carb, low sugar diet, reduce portion size, continue improving regular exercise Follow-up as planned q 6-12 months pending A1c trend

## 2018-05-20 NOTE — Assessment & Plan Note (Signed)
Stable without flare up Not requiring any maintenance therapy Chronic history of tobacco abuse Smoking cessation reviewed, not ready still trying to gradually wean Follow-up, consider Albuterol PRN vs maintenance if worsening

## 2018-05-20 NOTE — Assessment & Plan Note (Signed)
Previously controlled lipids on lifestyle off statin Will check fasting lipid today and follow-up result Encourage maintain healthy lifestyle, diet exercise - likely will recommend resume fish oil OTC if indicated

## 2018-05-20 NOTE — Assessment & Plan Note (Signed)
Active smoker, continues to wean down on E-cigs Not ready to quit Follow-up if ready to try med rx or other option

## 2018-05-20 NOTE — Assessment & Plan Note (Signed)
Stable, chronic underlying pain etiology with bilateral sciatica - now seems improved Failed other muscle relaxants Flexeril, Baclofen  Plan Re-order skelaxin PRN for back Follow-up as planned, may need repeat imaging, PT, vs refer ortho in future

## 2018-05-21 ENCOUNTER — Telehealth: Payer: Self-pay | Admitting: Nurse Practitioner

## 2018-05-21 LAB — CBC WITH DIFFERENTIAL/PLATELET
BASOS PCT: 0.5 %
Basophils Absolute: 38 cells/uL (ref 0–200)
EOS PCT: 0.9 %
Eosinophils Absolute: 68 cells/uL (ref 15–500)
HCT: 45.5 % — ABNORMAL HIGH (ref 35.0–45.0)
HEMOGLOBIN: 15.5 g/dL (ref 11.7–15.5)
Lymphs Abs: 2706 cells/uL (ref 850–3900)
MCH: 30.5 pg (ref 27.0–33.0)
MCHC: 34.1 g/dL (ref 32.0–36.0)
MCV: 89.6 fL (ref 80.0–100.0)
MPV: 12.2 fL (ref 7.5–12.5)
Monocytes Relative: 5.8 %
NEUTROS ABS: 4347 {cells}/uL (ref 1500–7800)
Neutrophils Relative %: 57.2 %
PLATELETS: 215 10*3/uL (ref 140–400)
RBC: 5.08 10*6/uL (ref 3.80–5.10)
RDW: 13.2 % (ref 11.0–15.0)
Total Lymphocyte: 35.6 %
WBC mixed population: 441 cells/uL (ref 200–950)
WBC: 7.6 10*3/uL (ref 3.8–10.8)

## 2018-05-21 LAB — COMPLETE METABOLIC PANEL WITH GFR
AG RATIO: 1.7 (calc) (ref 1.0–2.5)
ALBUMIN MSPROF: 4.8 g/dL (ref 3.6–5.1)
ALKALINE PHOSPHATASE (APISO): 96 U/L (ref 33–115)
ALT: 34 U/L — ABNORMAL HIGH (ref 6–29)
AST: 24 U/L (ref 10–35)
BUN: 8 mg/dL (ref 7–25)
CO2: 29 mmol/L (ref 20–32)
CREATININE: 0.8 mg/dL (ref 0.50–1.10)
Calcium: 10.3 mg/dL — ABNORMAL HIGH (ref 8.6–10.2)
Chloride: 104 mmol/L (ref 98–110)
GFR, Est African American: 101 mL/min/{1.73_m2} (ref >=60)
GFR, Est Non African American: 87 mL/min/{1.73_m2} (ref >=60)
GLOBULIN: 2.8 g/dL (ref 1.9–3.7)
Glucose, Bld: 98 mg/dL (ref 65–99)
Potassium: 4.6 mmol/L (ref 3.5–5.3)
SODIUM: 140 mmol/L (ref 135–146)
Total Bilirubin: 0.4 mg/dL (ref 0.2–1.2)
Total Protein: 7.6 g/dL (ref 6.1–8.1)

## 2018-05-21 LAB — LIPID PANEL
CHOL/HDL RATIO: 4.9 (calc) (ref <5.0)
CHOLESTEROL: 200 mg/dL — AB (ref <200)
HDL: 41 mg/dL — ABNORMAL LOW (ref >50)
LDL Cholesterol (Calc): 129 mg/dL (calc) — ABNORMAL HIGH
NON-HDL CHOLESTEROL (CALC): 159 mg/dL — AB (ref <130)
Triglycerides: 179 mg/dL — ABNORMAL HIGH (ref <150)

## 2018-05-21 LAB — HEMOGLOBIN A1C
HEMOGLOBIN A1C: 5.8 %{Hb} — AB (ref <5.7)
Mean Plasma Glucose: 120 (calc)
eAG (mmol/L): 6.6 (calc)

## 2018-05-21 LAB — T4, FREE: FREE T4: 1.2 ng/dL (ref 0.8–1.8)

## 2018-05-21 LAB — TSH: TSH: 1.64 mIU/L

## 2018-05-21 NOTE — Telephone Encounter (Signed)
I called patient to review her lab results that were released to mychart.  Explained LFT mild elevation, not a concern likely due to higher cholesterol.  She asked about fish oil, advised take 1000mg  twice daily for now, defer repeat lipid check for now, anticipate 1 year  Denise PilarAlexander Karamalegos, DO [facility] 05/21/2018, 12:07 PM

## 2018-05-21 NOTE — Telephone Encounter (Signed)
Pt asked for a call back to discuss lab results [phone]

## 2018-05-28 DIAGNOSIS — H5213 Myopia, bilateral: Secondary | ICD-10-CM | POA: Diagnosis not present

## 2018-06-13 DIAGNOSIS — H524 Presbyopia: Secondary | ICD-10-CM | POA: Diagnosis not present

## 2018-07-02 ENCOUNTER — Other Ambulatory Visit: Payer: Self-pay | Admitting: Family Medicine

## 2018-07-02 DIAGNOSIS — F339 Major depressive disorder, recurrent, unspecified: Secondary | ICD-10-CM

## 2018-07-08 ENCOUNTER — Other Ambulatory Visit: Payer: Self-pay | Admitting: Nurse Practitioner

## 2018-07-08 DIAGNOSIS — F339 Major depressive disorder, recurrent, unspecified: Secondary | ICD-10-CM

## 2018-07-08 MED ORDER — PAROXETINE HCL 10 MG PO TABS: 10.0000 mg | ORAL_TABLET | Freq: Every day | ORAL | 1 refills | Status: DC

## 2018-07-12 ENCOUNTER — Telehealth: Payer: Self-pay | Admitting: Family Medicine

## 2018-07-17 ENCOUNTER — Ambulatory Visit: Payer: Medicaid Other | Admitting: Family Medicine

## 2018-08-05 ENCOUNTER — Other Ambulatory Visit: Payer: Self-pay | Admitting: Family Medicine

## 2018-08-05 DIAGNOSIS — K219 Gastro-esophageal reflux disease without esophagitis: Secondary | ICD-10-CM

## 2018-08-13 NOTE — Telephone Encounter (Signed)
error 

## 2018-08-20 ENCOUNTER — Ambulatory Visit: Payer: Medicaid Other | Admitting: Nurse Practitioner

## 2018-09-11 ENCOUNTER — Other Ambulatory Visit: Payer: Self-pay | Admitting: Family Medicine

## 2018-09-11 NOTE — Telephone Encounter (Signed)
Patient will need PA for Metaxalone muscle relaxant - 800mg  tablet, generic, take half or 1 whole tablet 3 times a day for muscle spasms  This was attempted last year 06/2017 as well. It is documented with previous medications she has tried.  This is a non preferred medication.  She has tried and failed preferred meds: Baclofen, Cyclobenzaprine, Methocarbamol.  Please try to submit PA - and notify me if it is denied and find out how many of the preferred she is required to try before this is accepted.  If needed we can switch to Tizanidine instead if still not covered.  Denise PilarAlexander Idora Brosious, DO [facility] 09/11/2018, 11:52 AM

## 2018-09-11 NOTE — Telephone Encounter (Signed)
PA approved.

## 2018-09-13 ENCOUNTER — Other Ambulatory Visit: Payer: Self-pay

## 2018-09-13 ENCOUNTER — Encounter: Payer: Self-pay | Admitting: Nurse Practitioner

## 2018-09-13 ENCOUNTER — Other Ambulatory Visit (HOSPITAL_COMMUNITY)
Admission: RE | Admit: 2018-09-13 | Discharge: 2018-09-13 | Disposition: A | Discharge status: Home / Self Care | Payer: Medicaid Other | Source: Ambulatory Visit | Attending: Nurse Practitioner | Admitting: Nurse Practitioner

## 2018-09-13 ENCOUNTER — Ambulatory Visit (INDEPENDENT_AMBULATORY_CARE_PROVIDER_SITE_OTHER): Payer: Medicaid Other | Admitting: Nurse Practitioner

## 2018-09-13 VITALS — BP 131/68 | HR 65 | Temp 98.4°F | Ht 64.5 in | Wt 158.8 lb

## 2018-09-13 DIAGNOSIS — Z1239 Encounter for other screening for malignant neoplasm of breast: Secondary | ICD-10-CM

## 2018-09-13 DIAGNOSIS — Z124 Encounter for screening for malignant neoplasm of cervix: Secondary | ICD-10-CM

## 2018-09-13 DIAGNOSIS — K219 Gastro-esophageal reflux disease without esophagitis: Secondary | ICD-10-CM

## 2018-09-13 DIAGNOSIS — Z780 Asymptomatic menopausal state: Secondary | ICD-10-CM | POA: Diagnosis not present

## 2018-09-13 NOTE — Progress Notes (Signed)
Subjective:    Patient ID: Denise Estrada, female    DOB: October 31, 1970, 47 y.o.   MRN: [id number]  Denise Estrada is a 47 y.o. female presenting on 09/13/2018 for Gynecologic Exam (w/ history of HPV)   HPI Cervical Cancer Screening History of abnormal pap smear 1997 (abnormal cells, s/p cervical biopsy, removing large portion), again around 2010/2011 had abnormal cells and dx with HPV. Last pap smear 2016, normal and negative HPV. Next due 2019-2021 - today she requests to proceed with pap this year in 2019.  Patient reports LMP > 18 months ago, so patient now considered post-menopausal.  GERD Patient reports she needs refill of H2 blocker.  Review of last notes indicates patient was changed to PPI with plan to stop H2 blocker.  Patient has taken her H2 blocker only once daily since starting PPI and reports no Bleeding, gastric pain, or reflux.  She reports she thought she was to continue taking both medications.  Social History   Tobacco Use  . Smoking status: Current Every Day Smoker    Packs/day: 0.50    Years: 20.00    Pack years: 10.00    Types: Cigarettes  . Smokeless tobacco: Current User  . Tobacco comment: 3/4 pack for most of 20 years  Substance Use Topics  . Alcohol use: No  . Drug use: No    Review of Systems Per HPI unless specifically indicated above     Objective:    BP 131/68 (BP Location: Left Arm, Patient Position: Sitting, Cuff Size: Normal)   Pulse 65   Temp 98.4 F (36.9 C) (Oral)   Ht 5' 4.5" (1.638 m)   Wt 158 lb 12.8 oz (72 kg)   BMI 26.84 kg/m   Wt Readings from Last 3 Encounters:  09/13/18 158 lb 12.8 oz (72 kg)  05/20/18 159 lb (72.1 kg)  09/29/17 154 lb (69.9 kg)    Physical Exam Vitals signs reviewed.  Constitutional:      General: She is not in acute distress.    Appearance: She is well-developed.  HENT:     Head: Normocephalic and atraumatic.  Cardiovascular:     Rate and Rhythm: Normal rate and regular rhythm.     Pulses:      Radial pulses are 2+ on the right side and 2+ on the left side.       Posterior tibial pulses are 1+ on the right side and 1+ on the left side.     Heart sounds: Normal heart sounds, S1 normal and S2 normal.  Pulmonary:     Effort: Pulmonary effort is normal. No respiratory distress.     Breath sounds: Normal breath sounds and air entry.  Chest:     Comments: Breast - Normal exam w/ symmetric breasts, no mass, no nipple discharge, no skin changes or tenderness. Abdominal:     General: Bowel sounds are normal. There is no distension.     Palpations: Abdomen is soft.     Tenderness: There is no abdominal tenderness.     Hernia: No hernia is present.  Genitourinary:       Comments: Normal external female genitalia without lesions or fusion. Vaginal canal without lesions. Normal appearing cervix without lesions or friability other than evidence of prior surgery as in picture of RUQ of cervix. Physiologic discharge on exam. Bimanual exam without adnexal masses, enlarged uterus, or cervical motion tenderness. - Pelvic Exam chaperone declined by patient. Musculoskeletal:     Right lower  leg: No edema.     Left lower leg: No edema.  Skin:    General: Skin is warm and dry.     Capillary Refill: Capillary refill takes less than 2 seconds.  Neurological:     Mental Status: She is alert and oriented to person, place, and time.  Psychiatric:        Attention and Perception: Attention normal.        Mood and Affect: Mood and affect normal.        Behavior: Behavior normal. Behavior is cooperative.        Thought Content: Thought content normal.        Judgment: Judgment normal.    Results for orders placed or performed in visit on 05/20/18  Hemoglobin A1c  Result Value Ref Range   Hgb A1c MFr Bld 5.8 (H) <5.7 % of total Hgb   Mean Plasma Glucose 120 (calc)   eAG (mmol/L) 6.6 (calc)  CBC with Differential/Platelet  Result Value Ref Range   WBC 7.6 3.8 - 10.8 Thousand/uL   RBC 5.08 3.80 -  5.10 Million/uL   Hemoglobin 15.5 11.7 - 15.5 g/dL   HCT 16.1 (H) 09.6 - 04.5 %   MCV 89.6 80.0 - 100.0 fL   MCH 30.5 27.0 - 33.0 pg   MCHC 34.1 32.0 - 36.0 g/dL   RDW 40.9 81.1 - 91.4 %   Platelets 215 140 - 400 Thousand/uL   MPV 12.2 7.5 - 12.5 fL   Neutro Abs 4,347 1,500 - 7,800 cells/uL   Lymphs Abs 2,706 850 - 3,900 cells/uL   WBC mixed population 441 200 - 950 cells/uL   Eosinophils Absolute 68 15 - 500 cells/uL   Basophils Absolute 38 0 - 200 cells/uL   Neutrophils Relative % 57.2 %   Total Lymphocyte 35.6 %   Monocytes Relative 5.8 %   Eosinophils Relative 0.9 %   Basophils Relative 0.5 %  COMPLETE METABOLIC PANEL WITH GFR  Result Value Ref Range   Glucose, Bld 98 65 - 99 mg/dL   BUN 8 7 - 25 mg/dL   Creat 7.82 9.56 - 2.13 mg/dL   GFR, Est Non African American 87 > OR = 60 mL/min/1.31m2   GFR, Est African American 101 > OR = 60 mL/min/1.13m2   BUN/Creatinine Ratio NOT APPLICABLE 6 - 22 (calc)   Sodium 140 135 - 146 mmol/L   Potassium 4.6 3.5 - 5.3 mmol/L   Chloride 104 98 - 110 mmol/L   CO2 29 20 - 32 mmol/L   Calcium 10.3 (H) 8.6 - 10.2 mg/dL   Total Protein 7.6 6.1 - 8.1 g/dL   Albumin 4.8 3.6 - 5.1 g/dL   Globulin 2.8 1.9 - 3.7 g/dL (calc)   AG Ratio 1.7 1.0 - 2.5 (calc)   Total Bilirubin 0.4 0.2 - 1.2 mg/dL   Alkaline phosphatase (APISO) 96 33 - 115 U/L   AST 24 10 - 35 U/L   ALT 34 (H) 6 - 29 U/L  Lipid panel  Result Value Ref Range   Cholesterol 200 (H) <200 mg/dL   HDL 41 (L) >08 mg/dL   Triglycerides 657 (H) <150 mg/dL   LDL Cholesterol (Calc) 129 (H) mg/dL (calc)   Total CHOL/HDL Ratio 4.9 <5.0 (calc)   Non-HDL Cholesterol (Calc) 159 (H) <130 mg/dL (calc)  TSH  Result Value Ref Range   TSH 1.64 mIU/L  T4, free  Result Value Ref Range   Free T4 1.2 0.8 - 1.8 ng/dL  Assessment & Plan:   Problem List Items Addressed This Visit      Digestive   GERD (gastroesophageal reflux disease) Stable.  No need for continuing dual therapy at this  time.    Plan: 1. Educated on harms of dual therapy for prolonged period, H2 blocker desensitization. - Take ranitidine every other day for 2 weeks then stop.  Call clinic if abdominal symptoms return. 2. Continue PPI pantoprazole daily 3. Follow-up as scheduled with Dr. Kirtland BouchardK   Relevant Medications   ranitidine (ZANTAC) 150 MG capsule    Other Visit Diagnoses    Cervical cancer screening    -  Primary Stable post-surgical appearance of cervix.  No other complaints today.  Exam normal.  Complete PAP with HPV co-testing.  FOLLOW-UP with annual physical next year.   Relevant Orders   Cytology - PAP   Breast cancer screening     Clinical breast exam today is normal.  Continue regular screening with mammogram as planned.    Postmenopausal     Patient now without menses for over 18 months approx.  Patient now postmenopausal.  Declines labs to confirm for today.  Advised to call clinic if she has any vaginal bleeding moving forward.  Follow-up with annual physical 2020.      No orders of the defined types were placed in this encounter.   Follow up plan: No follow-ups on file.  Wilhelmina McardleLauren Tully Mcinturff, DNP, AGPCNP-BC Adult Gerontology Primary Care Nurse Practitioner [facility] 09/13/2018, 9:10 AM

## 2018-09-13 NOTE — Patient Instructions (Signed)
Dallas SchimkeJanet E Nouri,   Thank you for coming in to clinic today.  1. You have only your chronic changes to your cervix from partial removal of previous abnormal cells.  Exam is normal today.  2. For your ranitidine, it is best to stop regular use when you are taking pantoprazole.  - Take ranitidine every other day for 1-2 weeks, then stop.  If you have new stomach pain return, please notify us here at the clinic.  Please keep your regular follow-up appointments with Dr. Althea CharonKaramalegos.  If you have any other questions or concerns, please feel free to call the clinic or send a message through MyChart. You may also schedule an earlier appointment if necessary.  You will receive a survey after today's visit either digitally by e-mail or paper by [employer] mail. Your experiences and feedback matter to us.  Please respond so we know how we are doing as we provide care for you.   Wilhelmina McardleLauren Josalynn Johndrow, DNP, AGNP-BC Adult Gerontology Nurse Practitioner [facility], [facility]

## 2018-09-15 ENCOUNTER — Encounter: Payer: Self-pay | Admitting: Nurse Practitioner

## 2018-09-16 LAB — CYTOLOGY - PAP
Diagnosis: NEGATIVE
HPV: NOT DETECTED

## 2018-11-22 ENCOUNTER — Ambulatory Visit: Payer: Medicaid Other | Admitting: Family Medicine

## 2019-08-19 ENCOUNTER — Telehealth: Payer: Self-pay

## 2019-08-19 NOTE — Telephone Encounter (Signed)
The pt had questions about a possible exposure to COVID. She was given all necessary information and informed that she can go and be tested at the Testing Site located at [facility] between the hours of 10-3pm.

## 2020-04-01 ENCOUNTER — Encounter: Payer: Self-pay | Admitting: Family Medicine

## 2020-04-01 ENCOUNTER — Ambulatory Visit (INDEPENDENT_AMBULATORY_CARE_PROVIDER_SITE_OTHER): Payer: Medicaid Other | Admitting: Family Medicine

## 2020-04-01 ENCOUNTER — Other Ambulatory Visit: Payer: Self-pay

## 2020-04-01 VITALS — BP 118/67 | HR 63 | Temp 97.1°F | Resp 16 | Ht 64.5 in | Wt 150.2 lb

## 2020-04-01 DIAGNOSIS — K219 Gastro-esophageal reflux disease without esophagitis: Secondary | ICD-10-CM

## 2020-04-01 DIAGNOSIS — M4726 Other spondylosis with radiculopathy, lumbar region: Secondary | ICD-10-CM | POA: Diagnosis not present

## 2020-04-01 DIAGNOSIS — Z1231 Encounter for screening mammogram for malignant neoplasm of breast: Secondary | ICD-10-CM | POA: Diagnosis not present

## 2020-04-01 DIAGNOSIS — R635 Abnormal weight gain: Secondary | ICD-10-CM | POA: Diagnosis not present

## 2020-04-01 DIAGNOSIS — M7702 Medial epicondylitis, left elbow: Secondary | ICD-10-CM | POA: Diagnosis not present

## 2020-04-01 DIAGNOSIS — F3289 Other specified depressive episodes: Secondary | ICD-10-CM | POA: Diagnosis not present

## 2020-04-01 DIAGNOSIS — R0683 Snoring: Secondary | ICD-10-CM | POA: Diagnosis not present

## 2020-04-01 DIAGNOSIS — G894 Chronic pain syndrome: Secondary | ICD-10-CM | POA: Diagnosis not present

## 2020-04-01 DIAGNOSIS — F339 Major depressive disorder, recurrent, unspecified: Secondary | ICD-10-CM | POA: Diagnosis not present

## 2020-04-01 DIAGNOSIS — R7309 Other abnormal glucose: Secondary | ICD-10-CM

## 2020-04-01 DIAGNOSIS — Z79899 Other long term (current) drug therapy: Secondary | ICD-10-CM | POA: Diagnosis not present

## 2020-04-01 DIAGNOSIS — Z0000 Encounter for general adult medical examination without abnormal findings: Secondary | ICD-10-CM | POA: Insufficient documentation

## 2020-04-01 DIAGNOSIS — Z1211 Encounter for screening for malignant neoplasm of colon: Secondary | ICD-10-CM

## 2020-04-01 DIAGNOSIS — Z1239 Encounter for other screening for malignant neoplasm of breast: Secondary | ICD-10-CM | POA: Insufficient documentation

## 2020-04-01 MED ORDER — METAXALONE 800 MG PO TABS: 400.0000 mg | ORAL_TABLET | Freq: Three times a day (TID) | ORAL | 3 refills | Status: DC

## 2020-04-01 MED ORDER — PAROXETINE HCL 10 MG PO TABS: 10.0000 mg | ORAL_TABLET | Freq: Every day | ORAL | 1 refills | Status: DC

## 2020-04-01 MED ORDER — PANTOPRAZOLE SODIUM 20 MG PO TBEC: 20.0000 mg | DELAYED_RELEASE_TABLET | Freq: Every day | ORAL | 1 refills | Status: DC

## 2020-04-01 NOTE — Patient Instructions (Addendum)
Your medication refills have been sent to your pharmacy on file.  Have your labs drawn and we will be in touch once we receive the results.  I have sent in an order for a home sleep study, you should hear something from the company within 1 week, if you don't, please contact our office and I will follow up on this.  For Mammogram screening for breast cancer   Call the [facility] below anytime to schedule your own appointment now that order has been placed.  [facility] [place], [place] [address] Phone: [phone]  [facility] [address] [place], [place] [address] Phone: [phone]  The cologuard kit will be mailed to your home.  Once you receive, if you have any questions, they will have a customer service number you can contact and they will be able to walk you through the process.  Sleep hygiene is the single most effective treatment for sleep issues, but it is hard work.  Tips for a good night's sleep:  -Keep sleep environment comfortable and conducive to sleep -Keep regular sleep schedule 7 nights a week -Avoiding naps during the day -Avoiding going to bed until drowsy and ready to sleep, not trying to sleep, and not watching the clock -Get out of bed if not asleep within 15-20 minutes and returning only when drowsy -Avoiding caffeine, nicotine, alcohol, and other substances that interfere with sleep before bedtime -Take an hour before your set bedtime and start to wind down: bath/shower, no more TV or phone (the blue light can interfere with sleeping), listen to soothing music, or meditation -No TV in your bedroom -Exercising regularly, at least 6 hours before sleep. Yoga and Tai Chi can improve sleep quality  There are a lot of books and apps that may help guide you with any of the following:   -Progressive muscle relaxation (involves methodical tension and relaxation of different  Muscle groups throughout body)  Guided imagery  -YouTube - Gwynne Edinger has free videos on YouTube that can help with meditation and some   Abdominal breathing   Over the counter sleep aid one hour before bed- and gradually wean your use over 2-4 weeks  Some examples are : *Melatonin 5-10 mg *Sleepology (Can find on [employer]) taken according to packaging directions  There are a few online evidence based online programs, unfortunately they are not free.   Developed by a sleep expert who created a drug-free program for insomnia proven more effective than sleeping pills.  www.cbtforinsomnia.com Sleepio is an evidence-based digital sleep improvement program   www.sleepio.com SHUTi is designed to actively help retrain your body and mind for great sleep through six engaging Cognitive Behavioral Therapy for Insomnia strategy and learning sessions  BloggerCourse.com  The following recommendations are helpful adjuncts for helping rebalance your mood.  Eat a nourishing diet. Ensure adequate intake of calories, protein, carbs, fat, vitamins, and minerals. Prioritize whole foods at each meal, including meats, vegetables, fruits, nuts and seeds, etc.   Avoid inflammatory and/or "junk" foods, such as sugar, omega-6 fats, refined grains, chemicals, and preservatives are common in packaged and prepared foods. Minimize or completely avoid these ingredients and stick to whole foods with little to no additives. Cook from scratch as much as possible for more control over what you eat  Get enough sleep. Poor sleep is significantly associated with depression and anxiety. Make 7-9 hours of sleep nightly a top priority  Exercise appropriately.  Exercise is known to improve brain functioning and boost mood. Aim for 30 minutes of daily physical activity. Avoid "overtraining," which can cause mental disturbances  Assess your light exposure. Not enough natural light during the day and too much artificial  light can have a major impact on your mood. Get outside as often as possible during daylight hours. Minimize light exposure after dark and avoid the use of electronics that give off blue light before bed  Manage your stress.  Use daily stress management techniques such as meditation, yoga, or mindfulness to retrain your brain to respond differently to stress. Try deep breathing to deactivate your "fight or flight" response.  There are many of sources with apps like Headspace, Calm or a variety of YouTube videos (videos from Gwynne Edinger have guided meditation)  Prioritize your social life. Work on building social support with new friends or improve current relationships. Consider getting a pet that allows for companionship, social interaction, and physical touch. Try volunteering or joining a faith-based community to increase your sense of purpose  4-7-8 breathing technique at bedtime: breathe in to count of 4, hold breath for count of 7, exhale for count of 8; do 3-5 times for letting go of overactive thoughts  Take time to play Unstructured "play" time can help reduce anxiety and depression Options for play include music, games, sports, dance, art, etc.  Try to add daily omega 3 fatty acids, magnesium, B complex, and balanced amino acid supplements to help improve mood and anxiety.  Well Visit: Care Instructions Overview  Well visits can help you stay healthy. Your provider has checked your overall health and may have suggested ways to take good care of yourself. Your provider also may have recommended tests. At home, you can help prevent illness with healthy eating, regular exercise, and other steps.  Follow-up care is a key part of your treatment and safety. Be sure to make and go to all appointments, and call your provider if you are having problems. It's also a good idea to know your test results and keep a list of the medicines you take.  How can you care for yourself at home?   Get  screening tests that you and your doctor decide on. Screening helps find diseases before any symptoms appear.   Eat healthy foods. Choose fruits, vegetables, whole grains, protein, and low-fat dairy foods. Limit fat, especially saturated fat. Reduce salt in your diet.   Limit alcohol. If you are a man, have no more than 2 drinks a day or 14 drinks a week. If you are a woman, have no more than 1 drink a day or 7 drinks a week.   Get at least 30 minutes of physical activity on most days of the week.  We recommend you go no more than 2 days in a row without exercise. Walking is a good choice. You also may want to do other activities, such as running, swimming, cycling, or playing tennis or team sports. Discuss any changes in your exercise program with your provider.   Reach and stay at a healthy weight. This will lower your risk for many problems, such as obesity, diabetes, heart disease, and high blood pressure.   Do not smoke or allow others to smoke around you. If you need help quitting, talk to your provider about stop-smoking programs and medicines. These can increase your chances of quitting for good.  Can call 1-800-QUIT-NOW 8563111984) for the [facility], assistance with smoking cessation.  Care for your mental health. It is easy to get weighed down by worry and stress. Learn strategies to manage stress, like deep breathing and mindfulness, and stay connected with your family and community. If you find you often feel sad or hopeless, talk with your provider. Treatment can help.   Talk to your provider about whether you have any risk factors for sexually transmitted infections (STIs). You can help prevent STIs if you wait to have sex with a new partner (or partners) until you've each been tested for STIs. It also helps if you use condoms (female or female condoms) and if you limit your sex partners to one person who only has sex with you. Vaccines are available for some  STIs, such as HPV (these are age dependent).   Use birth control if it's important to you to prevent pregnancy. Talk with your provider about the choices available and what might be best for you.   If you think you may have a problem with alcohol or drug use, talk to your provider. This includes prescription medicines (such as amphetamines and opioids) and illegal drugs (such as cocaine and methamphetamine). Your provider can help you figure out what type of treatment is best for you.   If you have concerns about domestic violence or intimate partner violence, there are resources available to you. National Domestic Abuse Hotline (520) 452-1497   Protect your skin from too much sun. When you're outdoors from 10 a.m. to 4 p.m., stay in the shade or cover up with clothing and a hat with a wide brim. Wear sunglasses that block UV rays. Even when it's cloudy, put broad-spectrum sunscreen (SPF 30 or higher) on any exposed skin.   See a dentist one or two times a year for checkups and to have your teeth cleaned.   See an eye doctor once per year for an eye exam.   Wear a seat belt in the car.  When should you call for help?  Watch closely for changes in your health, and be sure to contact your provider if you have any problems or symptoms that concern you.  We will plan to see you back in 3 months for depression follow up  You will receive a survey after today's visit either digitally by e-mail or paper by [employer]. Your experiences and feedback matter to [place].  Please respond so we know how we are doing as we provide care for you.  Call us with any questions/concerns/needs.  It is my goal to be available to you for your health concerns.  Thanks for choosing me to be a partner in your healthcare needs!  Harlin Rain, FNP-C Family Nurse Practitioner [facility] Phone: [phone]

## 2020-04-01 NOTE — Assessment & Plan Note (Signed)
Reports snoring and feeling tired/fatigued, waking up and not being refreshed.  Discussed concerns for possible OSA.  Patient agreeable to sleep study.  Plan: 1. Sleep study order placed 2. RTC after sleep study completed

## 2020-04-01 NOTE — Assessment & Plan Note (Signed)
Medial epicondylitis of the left elbow.  Has been wearing strap to help with discomfort.  Reports has been wearing the strap above her left elbow.  Educated to wear below the left elbow, showing how to position.    Plan: 1. Wear brace as needed for help with reduction of epicondylitis.   2. If continued pain, will refer to Orthopedics

## 2020-04-01 NOTE — Assessment & Plan Note (Signed)
Reports has run out of her pantoprazole prescription, was previously well controlled with 20mg  before breakfast daily.  Requesting to restart.  Plan: 1. Restart pantoprazole 20mg  once daily. Side effects discussed. Pt wants to continue med. 2. Avoid diet triggers. Reviewed need to seek care if globus sensation, difficulty swallowing, s/sx of GI bleed. 3. Follow up as needed and in 3 months. 4. May consider GI referral in future if have worsening of symptoms.  Previously with refractory symptoms on max dose of H2 blocker

## 2020-04-01 NOTE — Assessment & Plan Note (Signed)
Currently unstable and poorly controlled.  Reports has recently separated from spouse and is working increased hours/shifts.  Finds that her sleep is poor and is chronically fatigued.  Has concerns for feeling irritable.  Requesting to restart paroxetine.  Plan: 1. Restart paroxetine 10mg  daily 2. Mood handout reviewed and provided 3. Follow up in 3 months, sooner if needed

## 2020-04-01 NOTE — Progress Notes (Signed)
Subjective:    Patient ID: Denise Estrada, female    DOB: April 09, 1971, 49 y.o.   MRN: [id number]  Denise Estrada is a 49 y.o. female presenting on 04/01/2020 for Annual Exam   HPI   HEALTH MAINTENANCE:  Weight/BMI: Overweight, BMI 25.38 Physical activity: Stays active Diet: Regular Seatbelt: Yes Sunscreen: No Mammogram: Ordered today PAP: Completed 09/13/2018 - DUE 09/13/2021 Colon cancer screening: Cologuard ordered today HIV & Hep C Screening: Offered and declined GC/CT: Offered and declined Optometry: Every 2 years Dentistry: Has dentures, does not go  IMMUNIZATIONS: Influenza: Due next season Tetanus: Reports around 2015 COVID: Discussed  STOP BANG SCREENING Snoring: Do you snore loudly? yes/no: Yes Tired: Do you often feel tired, fatigued, sleeping during the daytime? yes/no: Yes Observed: Has anyone observed you stop breathing or choking/gasping during your sleep? yes/no: No Pressure: Do you have or being treated for high blood pressure? yes/no: No BMI: Greater than 35? yes/no: No Age: Older than 50? yes/no: Yes Neck Side: Greater than 17 (males)/16 (females)? yes/no: No Gender: Born as female gender? yes/no: No  Depression screen [facility] 2/9 05/20/2018 07/24/2017 08/24/2016  Decreased Interest 0 0 0  Down, Depressed, Hopeless 1 0 0  PHQ - 2 Score 1 0 0  Altered sleeping 0 0 0  Tired, decreased energy 1 0 2  Change in appetite 0 0 0  Feeling bad or failure about yourself  0 0 0  Trouble concentrating 0 0 0  Moving slowly or fidgety/restless 0 0 0  Suicidal thoughts 0 0 0  PHQ-9 Score 2 0 2  Difficult doing work/chores Somewhat difficult Not difficult at all Not difficult at all    Past Medical History:  Diagnosis Date  . Depression   . GERD (gastroesophageal reflux disease)   . Hyperlipidemia   . Ulcer of esophagus    Past Surgical History:  Procedure Laterality Date  . biopsy cervix  1996/1997  . BREAST BIOPSY Right 2007   neg  . BREAST SURGERY      biopsy   Social History   Socioeconomic History  . Marital status: Married    Spouse name: Not on file  . Number of children: Not on file  . Years of education: Not on file  . Highest education level: Not on file  Occupational History  . Not on file  Tobacco Use  . Smoking status: Current Every Day Smoker    Packs/day: 0.50    Years: 20.00    Pack years: 10.00    Types: Cigarettes  . Smokeless tobacco: Never Used  . Tobacco comment: 3/4 pack for most of 20 years  Vaping Use  . Vaping Use: Every day  . Substances: Nicotine, Flavoring  Substance and Sexual Activity  . Alcohol use: No  . Drug use: No  . Sexual activity: Yes    Birth control/protection: None  Other Topics Concern  . Not on file  Social History Narrative  . Not on file   Social Determinants of Health   Financial Resource Strain:   . Difficulty of Paying Living Expenses:   Food Insecurity:   . Worried About Programme researcher, broadcasting/film/video in the Last Year:   . Barista in the Last Year:   Transportation Needs:   . Freight forwarder (Medical):   Marland Kitchen Lack of Transportation (Non-Medical):   Physical Activity:   . Days of Exercise per Week:   . Minutes of Exercise per Session:   Stress:   .  Feeling of Stress :   Social Connections:   . Frequency of Communication with Friends and Family:   . Frequency of Social Gatherings with Friends and Family:   . Attends Religious Services:   . Active Member of Clubs or Organizations:   . Attends Banker Meetings:   Marland Kitchen Marital Status:   Intimate Partner Violence:   . Fear of Current or Ex-Partner:   . Emotionally Abused:   Marland Kitchen Physically Abused:   . Sexually Abused:    Family History  Problem Relation Age of Onset  . Heart disease Mother   . Stroke Mother   . Diabetes Mother   . Depression Mother   . Cancer Father        lung  . Heart disease Father   . Stroke Father   . Diabetes Father   . Lung cancer Father   . Heart attack Brother   .  Diabetes Brother   . Hypertension Brother   . Diabetes Son        type 1  . Cancer Maternal Grandmother   . Hypertension Maternal Grandmother   . Breast cancer Maternal Grandmother        unsure late 43's?  . Stroke Maternal Grandfather   . Heart attack Maternal Grandfather   . Diabetes Maternal Grandfather   . Diabetes Paternal Grandmother    Current Outpatient Medications on File Prior to Visit  Medication Sig  . ranitidine (ZANTAC) 150 MG capsule Take 150 mg by mouth once. (Patient not taking: Reported on 04/01/2020)   No current facility-administered medications on file prior to visit.    Per HPI unless specifically indicated above     Objective:    BP 118/67 (BP Location: Left Arm, Patient Position: Sitting, Cuff Size: Normal)   Pulse 63   Temp (!) 97.1 F (36.2 C) (Temporal)   Resp 16   Ht 5' 4.5" (1.638 m)   Wt 150 lb 3.2 oz (68.1 kg)   LMP 03/25/2017 (Approximate) Comment: June or July 2018 last period  SpO2 100%   BMI 25.38 kg/m   Wt Readings from Last 3 Encounters:  04/01/20 150 lb 3.2 oz (68.1 kg)  09/13/18 158 lb 12.8 oz (72 kg)  05/20/18 159 lb (72.1 kg)   Physical Exam Vitals reviewed.  Constitutional:      General: She is not in acute distress.    Appearance: Normal appearance. She is well-developed, well-groomed and overweight. She is not ill-appearing or toxic-appearing.  HENT:     Head: Normocephalic and atraumatic.     Right Ear: Tympanic membrane, ear canal and external ear normal. There is no impacted cerumen.     Left Ear: Tympanic membrane, ear canal and external ear normal. There is no impacted cerumen.     Nose: Nose normal. No congestion or rhinorrhea.     Mouth/Throat:     Lips: Pink.     Mouth: Mucous membranes are moist.     Pharynx: Oropharynx is clear. Uvula midline. No oropharyngeal exudate or posterior oropharyngeal erythema.  Eyes:     General: Lids are normal. Vision grossly intact. No scleral icterus.       Right eye: No  discharge.        Left eye: No discharge.     Extraocular Movements: Extraocular movements intact.     Conjunctiva/sclera: Conjunctivae normal.     Pupils: Pupils are equal, round, and reactive to light.  Neck:     Thyroid: No thyroid mass  or thyromegaly.  Cardiovascular:     Rate and Rhythm: Normal rate and regular rhythm.     Pulses: Normal pulses.          Dorsalis pedis pulses are 2+ on the right side and 2+ on the left side.     Heart sounds: Normal heart sounds. No murmur heard.  No friction rub. No gallop.   Pulmonary:     Effort: Pulmonary effort is normal. No respiratory distress.     Breath sounds: Normal breath sounds.  Abdominal:     General: Abdomen is flat. Bowel sounds are normal. There is no distension.     Palpations: Abdomen is soft. There is no hepatomegaly, splenomegaly or mass.     Tenderness: There is no abdominal tenderness. There is no guarding or rebound.     Hernia: No hernia is present.  Musculoskeletal:        General: Tenderness present. Normal range of motion.     Cervical back: Normal range of motion and neck supple. No tenderness.     Right lower leg: No edema.     Left lower leg: No edema.     Comments: Normal tone, strength 5/5 BUE & BLE  Left medial epicondylitis  Feet:     Right foot:     Skin integrity: Skin integrity normal.     Left foot:     Skin integrity: Skin integrity normal.  Lymphadenopathy:     Cervical: No cervical adenopathy.  Skin:    General: Skin is warm and dry.     Capillary Refill: Capillary refill takes less than 2 seconds.  Neurological:     General: No focal deficit present.     Mental Status: She is alert and oriented to person, place, and time.     Cranial Nerves: No cranial nerve deficit.     Sensory: No sensory deficit.     Motor: No weakness.     Coordination: Coordination normal.     Gait: Gait normal.     Deep Tendon Reflexes: Reflexes normal.  Psychiatric:        Attention and Perception: Attention and  perception normal.        Mood and Affect: Mood and affect normal.        Speech: Speech normal.        Behavior: Behavior normal. Behavior is cooperative.        Thought Content: Thought content normal.        Cognition and Memory: Cognition and memory normal.        Judgment: Judgment normal.     Results for orders placed or performed in visit on 09/13/18  Cytology - PAP  Result Value Ref Range   Adequacy      Satisfactory for evaluation  endocervical/transformation zone component PRESENT.   Diagnosis      NEGATIVE FOR INTRAEPITHELIAL LESIONS OR MALIGNANCY.   HPV NOT DETECTED    Material Submitted CervicoVaginal Pap [ThinPrep Imaged]       Assessment & Plan:   Problem List Items Addressed This Visit      Digestive   GERD (gastroesophageal reflux disease)    Reports has run out of her pantoprazole prescription, was previously well controlled with 20mg  before breakfast daily.  Requesting to restart.  Plan: 1. Restart pantoprazole 20mg  once daily. Side effects discussed. Pt wants to continue med. 2. Avoid diet triggers. Reviewed need to seek care if globus sensation, difficulty swallowing, s/sx of GI bleed. 3. Follow up  as needed and in 3 months. 4. May consider GI referral in future if have worsening of symptoms.  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  Has been wearing strap to help with discomfort.  Reports has been wearing the strap above her left elbow.  Educated to wear below the left elbow, showing how to position.    Plan: 1. Wear brace as needed for help with reduction of epicondylitis.   2. If continued pain, will refer to Orthopedics       Relevant Medications   metaxalone (SKELAXIN) 800 MG tablet     Other   Chronic pain syndrome (Chronic)   Relevant Medications   PARoxetine (PAXIL) 10 MG tablet   metaxalone (SKELAXIN) 800 MG tablet   Depression    Currently unstable and poorly controlled.  Reports has recently separated from spouse and is working increased hours/shifts.  Finds that her sleep is poor and is chronically fatigued.  Has concerns for feeling irritable.  Requesting to restart paroxetine.  Plan: 1. Restart paroxetine 10mg  daily 2. Mood handout reviewed and provided 3. Follow up in 3 months, sooner if needed      Relevant Medications   PARoxetine (PAXIL) 10 MG tablet   Elevated hemoglobin A1c    Status unknown.  Recheck labs.  Followup after labs.       Relevant Orders   HgB A1c   Routine medical exam - Primary    Annual physical exam.  Well adult with no acute concerns.  Plan: 1. Obtain health maintenance screenings as above according to age. - Increase physical activity to 30 minutes most days of the week.  - Eat healthy diet high in vegetables and fruits; low in refined carbohydrates. - Screening labs and tests as ordered 2. Return 1 year for annual physical.       Relevant Orders   CBC with Differential   COMPLETE METABOLIC PANEL WITH GFR   Lipid Profile   Thyroid Panel With TSH   Snoring    Reports snoring and feeling tired/fatigued, waking up and not being refreshed.  Discussed concerns for possible OSA.  Patient agreeable to sleep study.  Plan: 1. Sleep study order placed 2. RTC after sleep study completed      Relevant Orders   Nocturnal polysomnography   Breast cancer screening    Pt last mammogram 03/2018.  Result BI-RADS 1, negative.  Plan: 1. Screening mammogram order placed.  Pt will call to schedule appointment.  Information given.       Relevant Orders   MM 3D SCREEN BREAST BILATERAL   Colon cancer screening    Pt requiring colon cancer screening.  Denies  family history of colon cancer.  Plan: - Discussed timing for initiation of colon cancer screening ACS vs USPSTF guidelines - Mutual decision making discussion for options of colonoscopy vs cologuard.  Pt prefers cologuard. - Ordered Cologuard today        Other Visit Diagnoses    Screening for colon cancer       Relevant Orders   Cologuard   Gastroesophageal reflux disease       Relevant Medications  pantoprazole (PROTONIX) 20 MG tablet   Recurrent depression (HCC)       Relevant Medications   PARoxetine (PAXIL) 10 MG tablet   Long-term use of high-risk medication       Relevant Orders   Lipid Profile   Thyroid Panel With TSH   Weight gain       Relevant Orders   Thyroid Panel With TSH      Meds ordered this encounter  Medications  . pantoprazole (PROTONIX) 20 MG tablet    Sig: Take 1 tablet (20 mg total) by mouth daily before breakfast.    Dispense:  90 tablet    Refill:  1  . PARoxetine (PAXIL) 10 MG tablet    Sig: Take 1 tablet (10 mg total) by mouth daily.    Dispense:  90 tablet    Refill:  1  . metaxalone (SKELAXIN) 800 MG tablet    Sig: Take 0.5-1 tablets (400-800 mg total) by mouth 3 (three) times daily.    Dispense:  90 tablet    Refill:  3      Follow up plan: Return in about 3 months (around 07/02/2020) for Depression f/u visit.  Charlaine DaltonNicole Marie Javares Kaufhold, FNP-C Family Nurse Practitioner [facility] 04/01/2020, 9:59 AM

## 2020-04-01 NOTE — Assessment & Plan Note (Addendum)
Annual physical exam.  Well adult with no acute concerns.  Plan: 1. Obtain health maintenance screenings as above according to age. - Increase physical activity to 30 minutes most days of the week.  - Eat healthy diet high in vegetables and fruits; low in refined carbohydrates. - Screening labs and tests as ordered 2. Return 1 year for annual physical.  

## 2020-04-01 NOTE — Assessment & Plan Note (Signed)
Pt last mammogram 03/2018.  Result BI-RADS 1, negative.  Plan: 1. Screening mammogram order placed.  Pt will call to schedule appointment.  Information given.

## 2020-04-01 NOTE — Assessment & Plan Note (Signed)
Status unknown.  Recheck labs.  Followup after labs.  

## 2020-04-01 NOTE — Assessment & Plan Note (Signed)
Pt requiring colon cancer screening.  Denies family history of colon cancer.  Plan: - Discussed timing for initiation of colon cancer screening ACS vs USPSTF guidelines - Mutual decision making discussion for options of colonoscopy vs cologuard.  Pt prefers cologuard. - Ordered Cologuard today 

## 2020-04-02 ENCOUNTER — Encounter: Payer: Self-pay | Admitting: Family Medicine

## 2020-04-02 LAB — COMPLETE METABOLIC PANEL WITH GFR
AG Ratio: 1.7 (calc) (ref 1.0–2.5)
ALT: 17 U/L (ref 6–29)
AST: 17 U/L (ref 10–35)
Albumin: 4.7 g/dL (ref 3.6–5.1)
Alkaline phosphatase (APISO): 98 U/L (ref 37–153)
BUN: 10 mg/dL (ref 7–25)
CO2: 28 mmol/L (ref 20–32)
Calcium: 9.8 mg/dL (ref 8.6–10.4)
Chloride: 105 mmol/L (ref 98–110)
Creat: 0.65 mg/dL (ref 0.50–1.05)
GFR, Est African American: 120 mL/min/{1.73_m2} (ref >=60)
GFR, Est Non African American: 104 mL/min/{1.73_m2} (ref >=60)
Globulin: 2.7 g/dL (calc) (ref 1.9–3.7)
Glucose, Bld: 81 mg/dL (ref 65–99)
Potassium: 4.1 mmol/L (ref 3.5–5.3)
Sodium: 141 mmol/L (ref 135–146)
Total Bilirubin: 0.4 mg/dL (ref 0.2–1.2)
Total Protein: 7.4 g/dL (ref 6.1–8.1)

## 2020-04-02 LAB — CBC WITH DIFFERENTIAL/PLATELET
Absolute Monocytes: 407 cells/uL (ref 200–950)
Basophils Absolute: 33 cells/uL (ref 0–200)
Basophils Relative: 0.4 %
Eosinophils Absolute: 83 cells/uL (ref 15–500)
Eosinophils Relative: 1 %
HCT: 46.9 % — ABNORMAL HIGH (ref 35.0–45.0)
Hemoglobin: 15.7 g/dL — ABNORMAL HIGH (ref 11.7–15.5)
Lymphs Abs: 2208 cells/uL (ref 850–3900)
MCH: 31.6 pg (ref 27.0–33.0)
MCHC: 33.5 g/dL (ref 32.0–36.0)
MCV: 94.4 fL (ref 80.0–100.0)
MPV: 12.1 fL (ref 7.5–12.5)
Monocytes Relative: 4.9 %
Neutro Abs: 5569 cells/uL (ref 1500–7800)
Neutrophils Relative %: 67.1 %
Platelets: 190 10*3/uL (ref 140–400)
RBC: 4.97 10*6/uL (ref 3.80–5.10)
RDW: 12.6 % (ref 11.0–15.0)
Total Lymphocyte: 26.6 %
WBC: 8.3 10*3/uL (ref 3.8–10.8)

## 2020-04-02 LAB — THYROID PANEL WITH TSH
Free Thyroxine Index: 2.2 (ref 1.4–3.8)
T3 Uptake: 28 % (ref 22–35)
T4, Total: 7.8 ug/dL (ref 5.1–11.9)
TSH: 1.36 mIU/L

## 2020-04-02 LAB — LIPID PANEL
Cholesterol: 188 mg/dL (ref <200)
HDL: 47 mg/dL — ABNORMAL LOW (ref >=50)
LDL Cholesterol (Calc): 114 mg/dL (calc) — ABNORMAL HIGH
Non-HDL Cholesterol (Calc): 141 mg/dL (calc) — ABNORMAL HIGH (ref <130)
Total CHOL/HDL Ratio: 4 (calc) (ref <5.0)
Triglycerides: 158 mg/dL — ABNORMAL HIGH (ref <150)

## 2020-04-02 LAB — HEMOGLOBIN A1C
Hgb A1c MFr Bld: 5.7 % of total Hgb — ABNORMAL HIGH (ref <5.7)
Mean Plasma Glucose: 117 (calc)
eAG (mmol/L): 6.5 (calc)

## 2020-04-29 DIAGNOSIS — Z1211 Encounter for screening for malignant neoplasm of colon: Secondary | ICD-10-CM | POA: Diagnosis not present

## 2020-04-29 DIAGNOSIS — Z1212 Encounter for screening for malignant neoplasm of rectum: Secondary | ICD-10-CM | POA: Diagnosis not present

## 2020-04-29 LAB — COLOGUARD: Cologuard: NEGATIVE

## 2020-05-06 LAB — COLOGUARD: COLOGUARD: NEGATIVE

## 2020-05-07 ENCOUNTER — Other Ambulatory Visit: Payer: Self-pay | Admitting: Family Medicine

## 2020-07-01 ENCOUNTER — Ambulatory Visit: Payer: Medicaid Other | Admitting: Family Medicine

## 2020-09-20 ENCOUNTER — Other Ambulatory Visit: Payer: Medicaid Other

## 2020-09-20 DIAGNOSIS — Z20822 Contact with and (suspected) exposure to covid-19: Secondary | ICD-10-CM | POA: Diagnosis not present

## 2020-09-22 ENCOUNTER — Encounter: Payer: Self-pay | Admitting: Family Medicine

## 2020-09-22 ENCOUNTER — Ambulatory Visit: Payer: Self-pay | Admitting: *Deleted

## 2020-09-22 LAB — SARS-COV-2, NAA 2 DAY TAT

## 2020-09-22 LAB — NOVEL CORONAVIRUS, NAA: SARS-CoV-2, NAA: DETECTED — AB

## 2020-09-22 NOTE — Telephone Encounter (Signed)
I returned her call.   She is positive for covid.   Her job is telling her she has to retest and be negative before returning to work. Everything she has read and heard is saying 90 days before being retested.   I let her know that is correct. She was directed to have her job check the [employer] site [employer] to verify that the 90 day period is correct.   She is going to do that and if they still require she be retested she will call us back.  She was agreeable to that.   "I know I don't need to be retested it's getting them to know that".   I'm cleared to go back to work Jan. 3, 2022 as long as I meet the quarantine criteria.   I'm feeling much better even now.  She will call us bak if needed.   Reason for Disposition . COVID-19 Testing, questions about  Answer Assessment - Initial Assessment Questions 1. COVID-19 DIAGNOSIS: "Who made your COVID-19 diagnosis?" "Was it confirmed by a positive lab test?" If not diagnosed by a HCP, ask "Are there lots of cases (community spread) where you live?" Note: See public health department website, if unsure.     I have a positive covid test.   I've been out of work.   I got sick on Friday.   My job wants me to have a test again before I can return but all the information I have read and been told said I don't need to retest before 90 days because my test will still show me positive. I'm not sure what to do. I spoke with a nurse this morning who called me and told me I was positive and went over the quarantine stuff with me.   She said I was cleared to go back to work on Jan. 3, 2022 as long as I met the quarantine criteria.   My job still wants me to have a test.  2. COVID-19 EXPOSURE: "Was there any known exposure to [place] before the symptoms began?" CDC Definition of close contact: within 6 feet (2 meters) for a total of 15 minutes or more over a 24-hour period.      My daughter was tested positive on 09/16/2020. 3. ONSET: "When did the COVID-19 symptoms  start?"      Friday. 4. WORST SYMPTOM: "What is your worst symptom?" (e.g., cough, fever, shortness of breath, muscle aches)     I'm feeling much better now.   I just need to know what to do about my job situation and retesting again. 5. COUGH: "Do you have a cough?" If Yes, ask: "How bad is the cough?"       *No Answer* 6. FEVER: "Do you have a fever?" If Yes, ask: "What is your temperature, how was it measured, and when did it start?"     *No Answer* 7. RESPIRATORY STATUS: "Describe your breathing?" (e.g., shortness of breath, wheezing, unable to speak)      *No Answer* 8. BETTER-SAME-WORSE: "Are you getting better, staying the same or getting worse compared to yesterday?"  If getting worse, ask, "In what way?"     *No Answer* 9. HIGH RISK DISEASE: "Do you have any chronic medical problems?" (e.g., asthma, heart or lung disease, weak immune system, obesity, etc.)     *No Answer* 10. VACCINE: "Have you gotten the COVID-19 vaccine?" If Yes ask: "Which one, how many shots, when did you get it?"       *  No Answer* 11. PREGNANCY: "Is there any chance you are pregnant?" "When was your last menstrual period?"       *No Answer* 12. OTHER SYMPTOMS: "Do you have any other symptoms?"  (e.g., chills, fatigue, headache, loss of smell or taste, muscle pain, sore throat; new loss of smell or taste especially support the diagnosis of COVID-19)       *No Answer*  Protocols used: CORONAVIRUS (COVID-19) DIAGNOSED OR SUSPECTED-A-AH

## 2021-06-02 ENCOUNTER — Telehealth: Payer: Medicaid Other | Admitting: Nurse Practitioner

## 2021-06-02 ENCOUNTER — Encounter: Payer: Self-pay | Admitting: Nurse Practitioner

## 2021-06-02 DIAGNOSIS — N3090 Cystitis, unspecified without hematuria: Secondary | ICD-10-CM | POA: Diagnosis not present

## 2021-06-02 MED ORDER — NITROFURANTOIN MONOHYD MACRO 100 MG PO CAPS: 100.0000 mg | ORAL_CAPSULE | Freq: Two times a day (BID) | ORAL | 0 refills | Status: AC

## 2021-06-02 NOTE — Progress Notes (Signed)
Virtual Visit Consent   Denise Estrada, you are scheduled for a virtual visit with a [facility] provider today.     Just as with appointments in the office, your consent must be obtained to participate.  Your consent will be active for this visit and any virtual visit you may have with one of our providers in the next 365 days.     If you have a MyChart account, a copy of this consent can be sent to you electronically.  All virtual visits are billed to your insurance company just like a traditional visit in the office.    As this is a virtual visit, video technology does not allow for your provider to perform a traditional examination.  This may limit your provider's ability to fully assess your condition.  If your provider identifies any concerns that need to be evaluated in person or the need to arrange testing (such as labs, EKG, etc.), we will make arrangements to do so.     Although advances in technology are sophisticated, we cannot ensure that it will always work on either your end or our end.  If the connection with a video visit is poor, the visit may have to be switched to a telephone visit.  With either a video or telephone visit, we are not always able to ensure that we have a secure connection.     I need to obtain your verbal consent now.   Are you willing to proceed with your visit today?    [name] has provided verbal consent on 06/02/2021 for a virtual visit (video or telephone).   Viviano Simas, FNP   Date: 06/02/2021 7:28 PM   Virtual Visit via Video Note   I, Viviano Simas, connected with  Denise Estrada  ([id number], 1971-04-21) on 06/02/21 at  7:30 PM EDT by a video-enabled telemedicine application and verified that I am speaking with the correct person using two identifiers.  Location: Patient: Virtual Visit Location Patient: Home Provider: Virtual Visit Location Provider: Office/Clinic   I discussed the limitations of evaluation and management by telemedicine and the  availability of in person appointments. The patient expressed understanding and agreed to proceed.    History of Present Illness: Denise Estrada is a 50 y.o. who identifies as a female who was assigned female at birth, and is being seen today with complaints of pain with urination and urgency and frequency for the past 6 days. She took a home UTI test from [employer] and it showed that she had Leukocytes present on her dip.  She has tried taking OTC AZO and drinking cranberry juice.  She has had two UTIs in the past.    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   Degenerative joint disease (DJD) of lumbar spine 06/19/2016   Perimenopausal 06/19/2016   Anxiety 09/09/2015   Chronic shoulder pain (Left) 09/09/2015   COPD (chronic obstructive pulmonary disease) (HCC) 09/09/2015   Tobacco abuse 09/09/2015   Fibrocystic breast disease 09/09/2015   GERD (gastroesophageal reflux disease) 09/09/2015   Herniated nucleus pulposus, L5-S1, right 09/09/2015   Hyperlipidemia 09/09/2015  Migraine 09/09/2015   Depression 09/09/2015    Allergies:  Allergies  Allergen Reactions   Erythromycin Base Anaphylaxis   Lansoprazole Nausea Only   Sulfamethoxazole-Trimethoprim Nausea And Vomiting   Tramadol Nausea And Vomiting   Medications:  Not currently taking any of her medications due to loss of insurance.   Observations/Objective: Patient is well-developed, well-nourished in no acute distress.  Resting comfortably at home.  Head is normocephalic, atraumatic.  No labored breathing.  Speech is clear and coherent with logical  content.  Patient is alert and oriented at baseline.    Assessment and Plan: 1. Cystitis  - nitrofurantoin, macrocrystal-monohydrate, (MACROBID) 100 MG capsule; Take 1 capsule (100 mg total) by mouth 2 (two) times daily for 5 days.  Dispense: 10 capsule; Refill: 0    Advised to increase water intake, seek f/u if symptoms persist or worsen as discussed  Follow Up Instructions: I discussed the assessment and treatment plan with the patient. The patient was provided an opportunity to ask questions and all were answered. The patient agreed with the plan and demonstrated an understanding of the instructions.  A copy of instructions were sent to the patient via MyChart.  The patient was advised to call back or seek an in-person evaluation if the symptoms worsen or if the condition fails to improve as anticipated.  Time:  I spent 10 minutes with the patient via telehealth technology discussing the above problems/concerns.    Viviano Simas, FNP

## 2021-07-07 ENCOUNTER — Encounter: Payer: Self-pay | Admitting: Nurse Practitioner

## 2021-07-07 ENCOUNTER — Telehealth: Payer: Medicaid Other | Admitting: Physician Assistant

## 2021-07-07 DIAGNOSIS — R3989 Other symptoms and signs involving the genitourinary system: Secondary | ICD-10-CM | POA: Diagnosis not present

## 2021-07-07 MED ORDER — NITROFURANTOIN MONOHYD MACRO 100 MG PO CAPS: 100.0000 mg | ORAL_CAPSULE | Freq: Two times a day (BID) | ORAL | 0 refills | Status: DC

## 2021-07-07 NOTE — Progress Notes (Signed)

## 2021-08-09 ENCOUNTER — Ambulatory Visit: Payer: Medicaid Other | Admitting: Internal Medicine

## 2021-08-11 ENCOUNTER — Encounter: Payer: Self-pay | Admitting: Internal Medicine

## 2021-08-11 ENCOUNTER — Ambulatory Visit: Payer: Medicaid Other | Admitting: Internal Medicine

## 2021-08-11 ENCOUNTER — Other Ambulatory Visit: Payer: Self-pay

## 2021-08-11 DIAGNOSIS — F32A Depression, unspecified: Secondary | ICD-10-CM | POA: Diagnosis not present

## 2021-08-11 DIAGNOSIS — E782 Mixed hyperlipidemia: Secondary | ICD-10-CM | POA: Diagnosis not present

## 2021-08-11 DIAGNOSIS — D751 Secondary polycythemia: Secondary | ICD-10-CM | POA: Diagnosis not present

## 2021-08-11 DIAGNOSIS — F419 Anxiety disorder, unspecified: Secondary | ICD-10-CM | POA: Diagnosis not present

## 2021-08-11 DIAGNOSIS — G894 Chronic pain syndrome: Secondary | ICD-10-CM | POA: Diagnosis not present

## 2021-08-11 DIAGNOSIS — R7303 Prediabetes: Secondary | ICD-10-CM | POA: Diagnosis not present

## 2021-08-11 DIAGNOSIS — K219 Gastro-esophageal reflux disease without esophagitis: Secondary | ICD-10-CM | POA: Diagnosis not present

## 2021-08-11 DIAGNOSIS — J432 Centrilobular emphysema: Secondary | ICD-10-CM

## 2021-08-11 MED ORDER — PAROXETINE HCL 10 MG PO TABS: 10.0000 mg | ORAL_TABLET | Freq: Every day | ORAL | 2 refills | Status: DC

## 2021-08-11 NOTE — Assessment & Plan Note (Signed)
Encouraged low carb diet Will check A1C at next visit

## 2021-08-11 NOTE — Assessment & Plan Note (Signed)
Not currently taking any medications for this Encouraged regular physical activity

## 2021-08-11 NOTE — Assessment & Plan Note (Signed)
Deteriorated Will restart Paroxetine 10 mg daily Will fill out FMLA for continuous leave from 12/1-12/31 Support offered

## 2021-08-11 NOTE — Assessment & Plan Note (Signed)
Try to avoid acidic foods Ok to take Tums OTC as needed

## 2021-08-11 NOTE — Patient Instructions (Signed)
Major Depressive Disorder, Adult °Major depressive disorder is a mental health condition. This disorder affects feelings. It can also affect the body. Symptoms of this condition last most of the day, almost every day, for 2 weeks. This disorder can affect: °Relationships. °Daily activities, such as work and school. °Activities that you normally like to do. °What are the causes? °The cause of this condition is not known. The disorder is likely caused by a mix of things, including: °Your personality, such as being a shy person. °Your behavior, or how you act toward others. °Your thoughts and feelings. °Too much alcohol or drugs. °How you react to stress. °Health and mental problems that you have had for a long time. °Things that hurt you in the past (trauma). °Big changes in your life, such as divorce. °What increases the risk? °The following factors may make you more likely to develop this condition: °Having family members with depression. °Being a woman. °Problems in the family. °Low levels of some brain chemicals. °Things that caused you pain as a child, especially if you lost a parent or were abused. °A lot of stress in your life, such as from: °Living without basic needs of life, such as food and shelter. °Being treated poorly because of race, sex, or religion (discrimination). °Health and mental problems that you have had for a long time. °What are the signs or symptoms? °The main symptoms of this condition are: °Being sad all the time. °Being grouchy all the time. °Loss of interest in things and activities. °Other symptoms include: °Sleeping too much or too little. °Eating too much or too little. °Gaining or losing weight, without knowing why. °Feeling tired or having low energy. °Being restless and weak. °Feeling hopeless, worthless, or guilty. °Trouble thinking clearly or making decisions. °Thoughts of hurting yourself or others, or thoughts of ending your life. °Spending a lot of time alone. °Inability to  complete common tasks of daily life. °If you have very bad MDD, you may: °Believe things that are not true. °Hear, see, taste, or feel things that are not there. °Have mild depression that lasts for at least 2 years. °Feel very sad and hopeless. °Have trouble speaking or moving. °How is this treated? °This condition may be treated with: °Talk therapy. This teaches you to know bad thoughts, feelings, and actions and how to change them. °This can also help you to communicate with others. °This can be done with members of your family. °Medicines. These can be used to treat worry (anxiety), depression, or low levels of chemicals in the brain. °Lifestyle changes. You may need to: °Limit alcohol use. °Limit drug use. °Get regular exercise. °Get plenty of sleep. °Make healthy eating choices. °Spend more time outdoors. °Brain stimulation. This treatment excites the brain. This is done when symptoms are very bad or have not gotten better with other treatments. °Follow these instructions at home: °Activity °Get regular exercise as told. °Spend time outdoors as told. °Make time to do the things you enjoy. °Find ways to deal with stress. Try to: °Meditate. °Do deep breathing. °Spend time in nature. °Keep a journal. °Return to your normal activities as told by your doctor. Ask your doctor what activities are safe for you. °Alcohol and drug use °If you drink alcohol: °Limit how much you use to: °0-1 drink a day for women. °0-2 drinks a day for men. °Be aware of how much alcohol is in your drink. In the [place] one drink equals one 12 oz bottle of beer (355 mL),   one 5 oz glass of wine (148 mL), or one 1½ oz glass of hard liquor (44 mL). °Talk to your doctor about: °Alcohol use. Alcohol can affect some medicines. °Any drug use. °General instructions ° °Take over-the-counter and prescription medicines and herbal preparations only as told by your doctor. °Eat a healthy diet. °Get a lot of sleep. °Think about joining a support group.  Your doctor may be able to suggest one. °Keep all follow-up visits as told by your doctor. This is important. °Where to find more information: °National Alliance on Mental Illness: www.nami.org °[place] National Institute of Mental Health: www.nimh.nih.gov °American Psychiatric Association: www.psychiatry.org/patients-families/ °Contact a doctor if: °Your symptoms get worse. °You get new symptoms. °Get help right away if: °You hurt yourself. °You have serious thoughts about hurting yourself or others. °You see, hear, taste, smell, or feel things that are not there. °If you ever feel like you may hurt yourself or others, or have thoughts about taking your own life, get help right away. Go to your nearest emergency department or: °Call your local emergency services (911 in the [place] °Call a suicide crisis helpline, such as the National Suicide Prevention Lifeline at 1-800-273-8255 or 988 in the [place] This is open 24 hours a day in the [place] °Text the Crisis Text Line at 741741 (in the [place] °Summary °Major depressive disorder is a mental health condition. This disorder affects feelings. Symptoms of this condition last most of the day, almost every day, for 2 weeks. °The symptoms of this disorder can cause problems with relationships and with daily activities. °There are treatments and support for people who get this disorder. You may need more than one type of treatment. °Get help right away if you have serious thoughts about hurting yourself or others. °This information is not intended to replace advice given to you by your health care provider. Make sure you discuss any questions you have with your health care provider. °Document Revised: 04/06/2021 Document Reviewed: 08/23/2019 °Elsevier Patient Education © 2022 [employer] Inc. ° °

## 2021-08-11 NOTE — Progress Notes (Signed)
Subjective:    Patient ID: Denise Estrada, female    DOB: Oct 21, 1970, 50 y.o.   MRN: [id number]  HPI  Pt presents to the clinic today for follow up of chronic conditions. She is establishing care with me today, transferring care from Danielle Rankin, NP.  Anxiety and Depression: Triggered by stress at work at home. She is not taking Paroxetine as prescribed. She is not currently seeing a therapist. She denies SI/HI. She would like to take some time off of work, would like FMLA forms completed.  COPD: She denies chronic cough or shortness of breath. She is not using any inhalers at this time. She does continue to smoke.  Chronic Pain: Mainly in her spine. She no longer takes Skelaxin as needed with good relief of symptoms. She is not following with pain management at this time.  GERD: Improved. Triggered by acidic foods. She is not taking Pantoprazole at this time. There is no upper GI on file.  HLD: Her last LDL was 114, triglycerides 158, 03/2020. She is not taking any cholesterol lowering medication at this time. She tries to consume a low fat diet.  Polycythemia: Her last H/H was 15.7/46.9, 03/2020. She does smoke.  Prediabetes: Her last A1C was 5.7%, 03/2020. She is not taking any oral diabetic medication at this time. She does not check her sugars.  Review of Systems  Past Medical History:  Diagnosis Date   Depression    GERD (gastroesophageal reflux disease)    Hyperlipidemia    Ulcer of esophagus     Current Outpatient Medications  Medication Sig Dispense Refill   metaxalone (SKELAXIN) 800 MG tablet Take 0.5-1 tablets (400-800 mg total) by mouth 3 (three) times daily. 90 tablet 3   nitrofurantoin, macrocrystal-monohydrate, (MACROBID) 100 MG capsule Take 1 capsule (100 mg total) by mouth 2 (two) times daily. 10 capsule 0   pantoprazole (PROTONIX) 20 MG tablet Take 1 tablet (20 mg total) by mouth daily before breakfast. 90 tablet 1   PARoxetine (PAXIL) 10 MG tablet Take 1 tablet  (10 mg total) by mouth daily. 90 tablet 1   ranitidine (ZANTAC) 150 MG capsule Take 150 mg by mouth once. (Patient not taking: Reported on 04/01/2020)     No current facility-administered medications for this visit.    Allergies  Allergen Reactions   Erythromycin Base Anaphylaxis   Lansoprazole Nausea Only   Sulfamethoxazole-Trimethoprim Nausea And Vomiting   Tramadol Nausea And Vomiting    Family History  Problem Relation Age of Onset   Heart disease Mother    Stroke Mother    Diabetes Mother    Depression Mother    Cancer Father        lung   Heart disease Father    Stroke Father    Diabetes Father    Lung cancer Father    Heart attack Brother    Diabetes Brother    Hypertension Brother    Diabetes Son        type 1   Cancer Maternal Grandmother    Hypertension Maternal Grandmother    Breast cancer Maternal Grandmother        unsure late 65's?   Stroke Maternal Grandfather    Heart attack Maternal Grandfather    Diabetes Maternal Grandfather    Diabetes Paternal Grandmother     Social History   Socioeconomic History   Marital status: Divorced    Spouse name: Not on file   Number of children: Not on file  Years of education: Not on file   Highest education level: Not on file  Occupational History   Not on file  Tobacco Use   Smoking status: Every Day    Packs/day: 0.50    Years: 20.00    Pack years: 10.00    Types: Cigarettes   Smokeless tobacco: Never   Tobacco comments:    3/4 pack for most of 20 years  Vaping Use   Vaping Use: Every day   Substances: Nicotine, Flavoring  Substance and Sexual Activity   Alcohol use: No   Drug use: No   Sexual activity: Yes    Birth control/protection: None  Other Topics Concern   Not on file  Social History Narrative   Not on file   Social Determinants of Health   Financial Resource Strain: Not on file  Food Insecurity: Not on file  Transportation Needs: Not on file  Physical Activity: Not on file   Stress: Not on file  Social Connections: Not on file  Intimate Partner Violence: Not on file     Constitutional: Denies fever, malaise, fatigue, headache or abrupt weight changes.  HEENT: Denies eye pain, eye redness, ear pain, ringing in the ears, wax buildup, runny nose, nasal congestion, bloody nose, or sore throat. Respiratory: Denies difficulty breathing, shortness of breath, cough or sputum production.   Cardiovascular: Denies chest pain, chest tightness, palpitations or swelling in the hands or feet.  Gastrointestinal: Denies abdominal pain, bloating, constipation, diarrhea or blood in the stool.  GU: Denies urgency, frequency, pain with urination, burning sensation, blood in urine, odor or discharge. Musculoskeletal: Pt reports chronic joint pain. Denies decrease in range of motion, difficulty with gait, muscle pain or joint swelling.  Skin: Denies redness, rashes, lesions or ulcercations.  Neurological: Denies dizziness, difficulty with memory, difficulty with speech or problems with balance and coordination.  Psych: Pt has a history of anxiety and depression. Denies SI/HI.  No other specific complaints in a complete review of systems (except as listed in HPI above).     Objective:   Physical Exam  BP 121/68 (BP Location: Right Arm, Patient Position: Sitting, Cuff Size: Normal)   Pulse 73   Temp 97.7 F (36.5 C) (Temporal)   Resp 17   Ht 5' 4.5" (1.638 m)   Wt 147 lb 9.6 oz (67 kg)   LMP 03/25/2017 (Approximate) Comment: June or July 2018 last period  SpO2 100%   BMI 24.94 kg/m   Wt Readings from Last 3 Encounters:  04/01/20 150 lb 3.2 oz (68.1 kg)  09/13/18 158 lb 12.8 oz (72 kg)  05/20/18 159 lb (72.1 kg)    General: Appears her stated age, well developed, well nourished in NAD. Skin: Warm, dry and intact.  HEENT: Head: normal shape and size; Eyes: sclera white and EOMs intact;  Cardiovascular: Normal rate and rhythm. S1,S2 noted.  No murmur, rubs or gallops  noted. No JVD or BLE edema. No carotid bruits noted. Pulmonary/Chest: Normal effort and positive vesicular breath sounds. No respiratory distress. No wheezes, rales or ronchi noted.  Abdomen:  Normal bowel sounds.  Musculoskeletal: No difficulty with gait.  Neurological: Alert and oriented.  Psychiatric: Mood and affect normal. Anxious appearing. Judgment and thought content normal.   BMET    Component Value Date/Time   NA 141 04/01/2020 0931   K 4.1 04/01/2020 0931   CL 105 04/01/2020 0931   CO2 28 04/01/2020 0931   GLUCOSE 81 04/01/2020 0931   BUN 10 04/01/2020 0931  CREATININE 0.65 04/01/2020 0931   CALCIUM 9.8 04/01/2020 0931   GFRNONAA 104 04/01/2020 0931   GFRAA 120 04/01/2020 0931    Lipid Panel     Component Value Date/Time   CHOL 188 04/01/2020 0931   TRIG 158 (H) 04/01/2020 0931   HDL 47 (L) 04/01/2020 0931   CHOLHDL 4.0 04/01/2020 0931   VLDL 19 07/17/2016 0800   LDLCALC 114 (H) 04/01/2020 0931    CBC    Component Value Date/Time   WBC 8.3 04/01/2020 0931   RBC 4.97 04/01/2020 0931   HGB 15.7 (H) 04/01/2020 0931   HCT 46.9 (H) 04/01/2020 0931   PLT 190 04/01/2020 0931   MCV 94.4 04/01/2020 0931   MCH 31.6 04/01/2020 0931   MCHC 33.5 04/01/2020 0931   RDW 12.6 04/01/2020 0931   LYMPHSABS 2,208 04/01/2020 0931   EOSABS 83 04/01/2020 0931   BASOSABS 33 04/01/2020 0931    Hgb A1C Lab Results  Component Value Date   HGBA1C 5.7 (H) 04/01/2020           Assessment & Plan:   [name], NP This visit occurred during the SARS-CoV-2 public health emergency.  Safety protocols were in place, including screening questions prior to the visit, additional usage of staff PPE, and extensive cleaning of exam room while observing appropriate contact time as indicated for disinfecting solutions.

## 2021-08-11 NOTE — Assessment & Plan Note (Signed)
Encouraged smoking cessation Will check CBC at next visit

## 2021-08-11 NOTE — Assessment & Plan Note (Signed)
Not currently on inhalers Encouraged smoking cessation

## 2021-08-11 NOTE — Assessment & Plan Note (Signed)
Will check CMET and lipid profile at next visit Encouraged low fat diet 

## 2021-08-12 ENCOUNTER — Encounter: Payer: Self-pay | Admitting: Internal Medicine

## 2021-08-12 ENCOUNTER — Other Ambulatory Visit: Payer: Self-pay | Admitting: Internal Medicine

## 2021-08-15 ENCOUNTER — Encounter: Payer: Self-pay | Admitting: Internal Medicine

## 2021-08-23 NOTE — Telephone Encounter (Signed)
Pt called back saying her HR manager wanted to know if [name] got the fax for her FMLA and if she has faxed them back to her HR dept.  CB#  [phone]

## 2021-08-31 NOTE — Telephone Encounter (Signed)
Pt calling in stating that her employer never received the fax with her FMLA paperwork. She states that she is needing to have these sent over again. Please advise.    Fax# [phone] Attn: Luanna Cole

## 2021-09-08 ENCOUNTER — Ambulatory Visit: Payer: Medicaid Other | Admitting: Internal Medicine

## 2021-09-22 ENCOUNTER — Ambulatory Visit: Payer: Medicaid Other | Admitting: Internal Medicine

## 2021-09-22 ENCOUNTER — Other Ambulatory Visit: Payer: Self-pay

## 2021-09-22 ENCOUNTER — Encounter: Payer: Self-pay | Admitting: Internal Medicine

## 2021-09-22 VITALS — BP 116/78 | HR 77 | Temp 97.7°F | Resp 18 | Ht 64.5 in | Wt 147.2 lb

## 2021-09-22 DIAGNOSIS — F32A Depression, unspecified: Secondary | ICD-10-CM | POA: Diagnosis not present

## 2021-09-22 DIAGNOSIS — F419 Anxiety disorder, unspecified: Secondary | ICD-10-CM | POA: Diagnosis not present

## 2021-09-22 DIAGNOSIS — Z0289 Encounter for other administrative examinations: Secondary | ICD-10-CM | POA: Diagnosis not present

## 2021-09-22 DIAGNOSIS — Z0001 Encounter for general adult medical examination with abnormal findings: Secondary | ICD-10-CM

## 2021-09-22 DIAGNOSIS — Z1159 Encounter for screening for other viral diseases: Secondary | ICD-10-CM | POA: Diagnosis not present

## 2021-09-22 MED ORDER — PAROXETINE HCL 20 MG PO TABS: 20.0000 mg | ORAL_TABLET | ORAL | 1 refills | Status: DC

## 2021-09-22 NOTE — Assessment & Plan Note (Signed)
Deteriorated We will increase Paroxetine to 20 mg daily Referral to psychology for therapy Will fill out FMLA for 5-week extension Support offered

## 2021-09-22 NOTE — Progress Notes (Signed)
Subjective:    Patient ID: Denise Estrada, female    DOB: [birth year], 50 y.o.   MRN: [id number]  HPI  Patient presents the clinic today for her annual exam.  She also wants to follow-up anxiety and depression.  She was started on paroxetine at her last visit.  She has noticed some relief but does feel like her symptoms could be better.  She is not currently seeing a therapist but would like a referral for 1.  She has FMLA forms to be completed to be extended for the next 5 weeks.  Flu: never Tetanus: 7-8 years ago COVID: never Pneumovax: never Shingrix: never Pap smear: 08/2018 Mammogram: 03/2018 Colon screening: Cologuard, 04/2020 Vision screening: as needed Dentist: as needed, dentures  Diet: She does eat meat. She consumes fruits and veggies. She tries to avoid fried foods. She drinks mostly water and sprite. Exercise: None   Review of Systems     Past Medical History:  Diagnosis Date   Depression    GERD (gastroesophageal reflux disease)    Hyperlipidemia    Ulcer of esophagus     Current Outpatient Medications  Medication Sig Dispense Refill   PARoxetine (PAXIL) 10 MG tablet Take 1 tablet (10 mg total) by mouth daily. 30 tablet 2   No current facility-administered medications for this visit.    Allergies  Allergen Reactions   Erythromycin Base Anaphylaxis   Lansoprazole Nausea Only   Sulfamethoxazole-Trimethoprim Nausea And Vomiting   Tramadol Nausea And Vomiting    Family History  Problem Relation Age of Onset   Heart disease Mother    Stroke Mother    Diabetes Mother    Depression Mother    Cancer Father        lung   Heart disease Father    Stroke Father    Diabetes Father    Lung cancer Father    Heart attack Brother    Diabetes Brother    Hypertension Brother    Diabetes Son        type 1   Cancer Maternal Grandmother    Hypertension Maternal Grandmother    Breast cancer Maternal Grandmother        unsure late 6's?   Stroke Maternal  Grandfather    Heart attack Maternal Grandfather    Diabetes Maternal Grandfather    Diabetes Paternal Grandmother     Social History   Socioeconomic History   Marital status: Divorced    Spouse name: Not on file   Number of children: Not on file   Years of education: Not on file   Highest education level: Not on file  Occupational History   Not on file  Tobacco Use   Smoking status: Every Day    Packs/day: 0.50    Years: 20.00    Pack years: 10.00    Types: Cigarettes   Smokeless tobacco: Never   Tobacco comments:    3/4 pack for most of 20 years  Vaping Use   Vaping Use: Every day   Substances: Nicotine, Flavoring  Substance and Sexual Activity   Alcohol use: No   Drug use: No   Sexual activity: Yes    Birth control/protection: None  Other Topics Concern   Not on file  Social History Narrative   Not on file   Social Determinants of Health   Financial Resource Strain: Not on file  Food Insecurity: Not on file  Transportation Needs: Not on file  Physical Activity: Not on file  Stress: Not on file  Social Connections: Not on file  Intimate Partner Violence: Not on file     Constitutional: Denies fever, malaise, fatigue, headache or abrupt weight changes.  HEENT: Denies eye pain, eye redness, ear pain, ringing in the ears, wax buildup, runny nose, nasal congestion, bloody nose, or sore throat. Respiratory: Denies difficulty breathing, shortness of breath, cough or sputum production.   Cardiovascular: Denies chest pain, chest tightness, palpitations or swelling in the hands or feet.  Gastrointestinal: Denies abdominal pain, bloating, constipation, diarrhea or blood in the stool.  GU: Denies urgency, frequency, pain with urination, burning sensation, blood in urine, odor or discharge. Musculoskeletal: Patient reports chronic joint and muscle pain.  Denies decrease in range of motion, difficulty with gait, or joint swelling.  Skin: Denies redness, rashes, lesions  or ulcercations.  Neurological: Denies dizziness, difficulty with memory, difficulty with speech or problems with balance and coordination.  Psych: Patient has a history of anxiety and depression.  Denies SI/HI.  No other specific complaints in a complete review of systems (except as listed in HPI above).  Objective:   Physical Exam  Pulse 77    Temp 97.7 F (36.5 C) (Temporal)    Resp 18    Ht 5' 4.5" (1.638 m)    Wt 147 lb 3.2 oz (66.8 kg)    LMP 03/25/2017 (Approximate) Comment: June or July 2018 last period   SpO2 100%    BMI 24.88 kg/m   Wt Readings from Last 3 Encounters:  08/11/21 147 lb 9.6 oz (67 kg)  04/01/20 150 lb 3.2 oz (68.1 kg)  09/13/18 158 lb 12.8 oz (72 kg)    General: Appears her stated age, well developed, well nourished in NAD. Skin: Warm, dry and intact.  HEENT: Head: normal shape and size; Eyes: sclera white and EOMs intact;  Neck:  Neck supple, trachea midline. No masses, lumps or thyromegaly present.  Cardiovascular: Normal rate and rhythm. S1,S2 noted.  No murmur, rubs or gallops noted. No JVD or BLE edema. No carotid bruits noted. Pulmonary/Chest: Normal effort and positive vesicular breath sounds. No respiratory distress. No wheezes, rales or ronchi noted.  Abdomen: Soft and nontender. Normal bowel sounds. No distention or masses noted. Liver, spleen and kidneys non palpable. Musculoskeletal: Strength 5/5 BUE/BLE.  No difficulty with gait.  Neurological: Alert and oriented. Cranial nerves II-XII grossly intact. Coordination normal.  Psychiatric: Mood and affect flat. Behavior is normal. Judgment and thought content normal.    BMET    Component Value Date/Time   NA 141 04/01/2020 0931   K 4.1 04/01/2020 0931   CL 105 04/01/2020 0931   CO2 28 04/01/2020 0931   GLUCOSE 81 04/01/2020 0931   BUN 10 04/01/2020 0931   CREATININE 0.65 04/01/2020 0931   CALCIUM 9.8 04/01/2020 0931   GFRNONAA 104 04/01/2020 0931   GFRAA 120 04/01/2020 0931    Lipid  Panel     Component Value Date/Time   CHOL 188 04/01/2020 0931   TRIG 158 (H) 04/01/2020 0931   HDL 47 (L) 04/01/2020 0931   CHOLHDL 4.0 04/01/2020 0931   VLDL 19 07/17/2016 0800   LDLCALC 114 (H) 04/01/2020 0931    CBC    Component Value Date/Time   WBC 8.3 04/01/2020 0931   RBC 4.97 04/01/2020 0931   HGB 15.7 (H) 04/01/2020 0931   HCT 46.9 (H) 04/01/2020 0931   PLT 190 04/01/2020 0931   MCV 94.4 04/01/2020 0931   MCH 31.6 04/01/2020 0931  MCHC 33.5 04/01/2020 0931   RDW 12.6 04/01/2020 0931   LYMPHSABS 2,208 04/01/2020 0931   EOSABS 83 04/01/2020 0931   BASOSABS 33 04/01/2020 0931    Hgb A1C Lab Results  Component Value Date   HGBA1C 5.7 (H) 04/01/2020            Assessment & Plan:   Preventative Health Maintenance:  She declines flu shot today She declines tetanus booster She declines COVID-vaccine She declines Pneumovax She declines Shingrix Pap smear UTD Mammogram has been ordered-she will call to schedule this Referral to GI for screening colonoscopy Encouraged her to consume a balanced diet and exercise regimen Advised her to see an eye doctor and dentist annually Will check CBC, c-Met, lipid, A1c and hep C today  RTC in 6 months, follow-up chronic conditions Webb Silversmith, NP This visit occurred during the SARS-CoV-2 public health emergency.  Safety protocols were in place, including screening questions prior to the visit, additional usage of staff PPE, and extensive cleaning of exam room while observing appropriate contact time as indicated for disinfecting solutions.

## 2021-09-22 NOTE — Telephone Encounter (Signed)
FMLA and short-term disability forms are in my out box ready to be faxed and scanned.  See message below as it looks like they go to 2 different fax numbers.

## 2021-09-22 NOTE — Patient Instructions (Signed)
Health Maintenance for Postmenopausal Women ?Menopause is a normal process in which your ability to get pregnant comes to an end. This process happens slowly over many months or years, usually between the ages of 48 and 55. Menopause is complete when you have missed your menstrual period for 12 months. ?It is important to talk with your health care provider about some of the most common conditions that affect women after menopause (postmenopausal women). These include heart disease, cancer, and bone loss (osteoporosis). Adopting a healthy lifestyle and getting preventive care can help to promote your health and wellness. The actions you take can also lower your chances of developing some of these common conditions. ?What are the signs and symptoms of menopause? ?During menopause, you may have the following symptoms: ?Hot flashes. These can be moderate or severe. ?Night sweats. ?Decrease in sex drive. ?Mood swings. ?Headaches. ?Tiredness (fatigue). ?Irritability. ?Memory problems. ?Problems falling asleep or staying asleep. ?Talk with your health care provider about treatment options for your symptoms. ?Do I need hormone replacement therapy? ?Hormone replacement therapy is effective in treating symptoms that are caused by menopause, such as hot flashes and night sweats. ?Hormone replacement carries certain risks, especially as you become older. If you are thinking about using estrogen or estrogen with progestin, discuss the benefits and risks with your health care provider. ?How can I reduce my risk for heart disease and stroke? ?The risk of heart disease, heart attack, and stroke increases as you age. One of the causes may be a change in the body's hormones during menopause. This can affect how your body uses dietary fats, triglycerides, and cholesterol. Heart attack and stroke are medical emergencies. There are many things that you can do to help prevent heart disease and stroke. ?Watch your blood pressure ?High  blood pressure causes heart disease and increases the risk of stroke. This is more likely to develop in people who have high blood pressure readings or are overweight. ?Have your blood pressure checked: ?Every 3-5 years if you are 18-39 years of age. ?Every year if you are 40 years old or older. ?Eat a healthy diet ? ?Eat a diet that includes plenty of vegetables, fruits, low-fat dairy products, and lean protein. ?Do not eat a lot of foods that are high in solid fats, added sugars, or sodium. ?Get regular exercise ?Get regular exercise. This is one of the most important things you can do for your health. Most adults should: ?Try to exercise for at least 150 minutes each week. The exercise should increase your heart rate and make you sweat (moderate-intensity exercise). ?Try to do strengthening exercises at least twice each week. Do these in addition to the moderate-intensity exercise. ?Spend less time sitting. Even light physical activity can be beneficial. ?Other tips ?Work with your health care provider to achieve or maintain a healthy weight. ?Do not use any products that contain nicotine or tobacco. These products include cigarettes, chewing tobacco, and vaping devices, such as e-cigarettes. If you need help quitting, ask your health care provider. ?Know your numbers. Ask your health care provider to check your cholesterol and your blood sugar (glucose). Continue to have your blood tested as directed by your health care provider. ?Do I need screening for cancer? ?Depending on your health history and family history, you may need to have cancer screenings at different stages of your life. This may include screening for: ?Breast cancer. ?Cervical cancer. ?Lung cancer. ?Colorectal cancer. ?What is my risk for osteoporosis? ?After menopause, you may be   at increased risk for osteoporosis. Osteoporosis is a condition in which bone destruction happens more quickly than new bone creation. To help prevent osteoporosis or  the bone fractures that can happen because of osteoporosis, you may take the following actions: ?If you are 19-50 years old, get at least 1,000 mg of calcium and at least 600 international units (IU) of vitamin D per day. ?If you are older than age 50 but younger than age 70, get at least 1,200 mg of calcium and at least 600 international units (IU) of vitamin D per day. ?If you are older than age 70, get at least 1,200 mg of calcium and at least 800 international units (IU) of vitamin D per day. ?Smoking and drinking excessive alcohol increase the risk of osteoporosis. Eat foods that are rich in calcium and vitamin D, and do weight-bearing exercises several times each week as directed by your health care provider. ?How does menopause affect my mental health? ?Depression may occur at any age, but it is more common as you become older. Common symptoms of depression include: ?Feeling depressed. ?Changes in sleep patterns. ?Changes in appetite or eating patterns. ?Feeling an overall lack of motivation or enjoyment of activities that you previously enjoyed. ?Frequent crying spells. ?Talk with your health care provider if you think that you are experiencing any of these symptoms. ?General instructions ?See your health care provider for regular wellness exams and vaccines. This may include: ?Scheduling regular health, dental, and eye exams. ?Getting and maintaining your vaccines. These include: ?Influenza vaccine. Get this vaccine each year before the flu season begins. ?Pneumonia vaccine. ?Shingles vaccine. ?Tetanus, diphtheria, and pertussis (Tdap) booster vaccine. ?Your health care provider may also recommend other immunizations. ?Tell your health care provider if you have ever been abused or do not feel safe at home. ?Summary ?Menopause is a normal process in which your ability to get pregnant comes to an end. ?This condition causes hot flashes, night sweats, decreased interest in sex, mood swings, headaches, or lack  of sleep. ?Treatment for this condition may include hormone replacement therapy. ?Take actions to keep yourself healthy, including exercising regularly, eating a healthy diet, watching your weight, and checking your blood pressure and blood sugar levels. ?Get screened for cancer and depression. Make sure that you are up to date with all your vaccines. ?This information is not intended to replace advice given to you by your health care provider. Make sure you discuss any questions you have with your health care provider. ?Document Revised: 01/31/2021 Document Reviewed: 01/31/2021 ?Elsevier Patient Education ? 2022 [employer] ? ?

## 2021-09-22 NOTE — Telephone Encounter (Signed)
Please review patient response.

## 2021-09-23 ENCOUNTER — Other Ambulatory Visit: Payer: Self-pay | Admitting: Internal Medicine

## 2021-09-23 LAB — LIPID PANEL
Cholesterol: 175 mg/dL
HDL: 49 mg/dL — ABNORMAL LOW
LDL Cholesterol (Calc): 102 mg/dL — ABNORMAL HIGH
Non-HDL Cholesterol (Calc): 126 mg/dL
Total CHOL/HDL Ratio: 3.6 (calc)
Triglycerides: 142 mg/dL

## 2021-09-23 LAB — CBC
HCT: 46.6 % — ABNORMAL HIGH (ref 35.0–45.0)
Hemoglobin: 15.5 g/dL (ref 11.7–15.5)
MCH: 31.3 pg (ref 27.0–33.0)
MCHC: 33.3 g/dL (ref 32.0–36.0)
MCV: 94 fL (ref 80.0–100.0)
MPV: 12.1 fL (ref 7.5–12.5)
Platelets: 185 10*3/uL (ref 140–400)
RBC: 4.96 10*6/uL (ref 3.80–5.10)
RDW: 13.3 % (ref 11.0–15.0)
WBC: 6.4 10*3/uL (ref 3.8–10.8)

## 2021-09-23 LAB — COMPLETE METABOLIC PANEL WITH GFR
AG Ratio: 1.8 (calc) (ref 1.0–2.5)
ALT: 20 U/L (ref 6–29)
AST: 15 U/L (ref 10–35)
Albumin: 4.5 g/dL (ref 3.6–5.1)
Alkaline phosphatase (APISO): 77 U/L (ref 37–153)
BUN: 8 mg/dL (ref 7–25)
CO2: 26 mmol/L (ref 20–32)
Calcium: 9.4 mg/dL (ref 8.6–10.4)
Chloride: 105 mmol/L (ref 98–110)
Creat: 0.76 mg/dL (ref 0.50–1.03)
Globulin: 2.5 g/dL (calc) (ref 1.9–3.7)
Glucose, Bld: 96 mg/dL (ref 65–139)
Potassium: 4.3 mmol/L (ref 3.5–5.3)
Sodium: 139 mmol/L (ref 135–146)
Total Bilirubin: 0.4 mg/dL (ref 0.2–1.2)
Total Protein: 7 g/dL (ref 6.1–8.1)
eGFR: 94 mL/min/{1.73_m2} (ref >=60)

## 2021-09-23 LAB — HEMOGLOBIN A1C
Hgb A1c MFr Bld: 5.7 %{Hb} — ABNORMAL HIGH
Mean Plasma Glucose: 117 mg/dL
eAG (mmol/L): 6.5 mmol/L

## 2021-09-23 LAB — HEPATITIS C ANTIBODY
Hepatitis C Ab: NONREACTIVE
SIGNAL TO CUT-OFF: 0.07

## 2021-09-28 ENCOUNTER — Ambulatory Visit: Payer: Medicaid Other | Admitting: Internal Medicine

## 2021-10-07 ENCOUNTER — Telehealth: Payer: Medicaid Other | Admitting: Physician Assistant

## 2021-10-07 ENCOUNTER — Telehealth: Payer: Self-pay | Admitting: Internal Medicine

## 2021-10-07 ENCOUNTER — Ambulatory Visit: Payer: Self-pay

## 2021-10-07 DIAGNOSIS — B9789 Other viral agents as the cause of diseases classified elsewhere: Secondary | ICD-10-CM | POA: Diagnosis not present

## 2021-10-07 DIAGNOSIS — J019 Acute sinusitis, unspecified: Secondary | ICD-10-CM | POA: Diagnosis not present

## 2021-10-07 MED ORDER — IPRATROPIUM BROMIDE 0.03 % NA SOLN: 2.0000 | Freq: Two times a day (BID) | NASAL | 0 refills | Status: DC

## 2021-10-07 MED ORDER — BENZONATATE 100 MG PO CAPS
100.0000 mg | ORAL_CAPSULE | Freq: Three times a day (TID) | ORAL | 0 refills | Status: DC | PRN
Start: 2021-10-07 — End: 2021-10-11

## 2021-10-07 MED ORDER — LIDOCAINE VISCOUS HCL 2 % MT SOLN: OROMUCOSAL | 0 refills | Status: DC

## 2021-10-07 NOTE — Telephone Encounter (Signed)
Copied from CRM [id number]. Topic: General - Other >> Oct 07, 2021  3:33 PM Traci Sermon wrote: Reason for CRM: Pt called in stating her Temporary Disability Insurance needs a clinical update on appt on 12/29, faxed over [phone], please advise.

## 2021-10-07 NOTE — Telephone Encounter (Signed)
Not a [facility] patient.  

## 2021-10-07 NOTE — Telephone Encounter (Signed)
°  Chief Complaint: Sinus congestion, sore throat Symptoms: Light green discharge from nose, pain with swallowing Frequency: Started 1 week ago  Pertinent Negatives: Patient denies fever Disposition: [] ED /[] Urgent Care (no appt availability in office) / [] Appointment(In office/virtual)/ [x]  [facility]/ [] Home Care/ [] Refused Recommended Disposition /[] [facility]/ []  Follow-up with PCP Additional Notes: My Chart e-visit    Reason for Disposition  [1] Fever returns after gone for over 24 hours AND [2] symptoms worse or not improved  Answer Assessment - Initial Assessment Questions 1. LOCATION: "Where does it hurt?"      Face 2. ONSET: "When did the sinus pain start?"  (e.g., hours, days)      1 week ago 3. SEVERITY: "How bad is the pain?"   (Scale 1-10; mild, moderate or severe)   - MILD (1-3): doesn't interfere with normal activities    - MODERATE (4-7): interferes with normal activities (e.g., work or school) or awakens from sleep   - SEVERE (8-10): excruciating pain and patient unable to do any normal activities        Mild 4. RECURRENT SYMPTOM: "Have you ever had sinus problems before?" If Yes, ask: "When was the last time?" and "What happened that time?"      Yes 5. NASAL CONGESTION: "Is the nose blocked?" If Yes, ask: "Can you open it or must you breathe through your mouth?"     Yes 6. NASAL DISCHARGE: "Do you have discharge from your nose?" If so ask, "What color?"     Light green 7. FEVER: "Do you have a fever?" If Yes, ask: "What is it, how was it measured, and when did it start?"      No 8. OTHER SYMPTOMS: "Do you have any other symptoms?" (e.g., sore throat, cough, earache, difficulty breathing)     Sore throat 9. PREGNANCY: "Is there any chance you are pregnant?" "When was your last menstrual period?"     No  Protocols used: Sinus Pain or Congestion-A-AH

## 2021-10-07 NOTE — Progress Notes (Signed)
E-Visit for Sinus Problems  We are sorry that you are not feeling well.  Here is how we plan to help!  Based on what you have shared with me it looks like you have sinusitis.  Sinusitis is inflammation and infection in the sinus cavities of the head.  Based on your presentation I believe you most likely have Acute Viral Sinusitis.This is an infection most likely caused by a virus. There is not specific treatment for viral sinusitis other than to help you with the symptoms until the infection runs its course.  You may use an oral decongestant such as Mucinex D or if you have glaucoma or high blood pressure use plain Mucinex. Saline nasal spray help and can safely be used as often as needed for congestion, I have prescribed: Ipratropium Bromide nasal spray 0.03% 2 sprays in eah nostril 2-3 times a day and tessalon perles for cough. I will also prescribe viscous lidocaine for the sore throat.  Some authorities believe that zinc sprays or the use of Echinacea may shorten the course of your symptoms.  Sinus infections are not as easily transmitted as other respiratory infection, however we still recommend that you avoid close contact with loved ones, especially the very young and elderly.  Remember to wash your hands thoroughly throughout the day as this is the number one way to prevent the spread of infection!  Home Care: Only take medications as instructed by your medical team. Do not take these medications with alcohol. A steam or ultrasonic humidifier can help congestion.  You can place a towel over your head and breathe in the steam from hot water coming from a faucet. Avoid close contacts especially the very young and the elderly. Cover your mouth when you cough or sneeze. Always remember to wash your hands.  Get Help Right Away If: You develop worsening fever or sinus pain. You develop a severe head ache or visual changes. Your symptoms persist after you have completed your treatment  plan.  Make sure you Understand these instructions. Will watch your condition. Will get help right away if you are not doing well or get worse.   Thank you for choosing an e-visit.  Your e-visit answers were reviewed by a board certified advanced clinical practitioner to complete your personal care plan. Depending upon the condition, your plan could have included both over the counter or prescription medications.  Please review your pharmacy choice. Make sure the pharmacy is open so you can pick up prescription now. If there is a problem, you may contact your provider through [employer] and have the prescription routed to another pharmacy.  Your safety is important to [place]. If you have drug allergies check your prescription carefully.   For the next 24 hours you can use MyChart to ask questions about today's visit, request a non-urgent call back, or ask for a work or school excuse. You will get an email in the next two days asking about your experience. I hope that your e-visit has been valuable and will speed your recovery.  I provided 5 minutes of non face-to-face time during this encounter for chart review and documentation.

## 2021-10-11 ENCOUNTER — Other Ambulatory Visit: Payer: Self-pay

## 2021-10-11 ENCOUNTER — Telehealth (INDEPENDENT_AMBULATORY_CARE_PROVIDER_SITE_OTHER): Payer: Medicaid Other | Admitting: Internal Medicine

## 2021-10-11 ENCOUNTER — Encounter: Payer: Self-pay | Admitting: Internal Medicine

## 2021-10-11 VITALS — Ht 64.5 in

## 2021-10-11 DIAGNOSIS — F32A Depression, unspecified: Secondary | ICD-10-CM | POA: Diagnosis not present

## 2021-10-11 DIAGNOSIS — F419 Anxiety disorder, unspecified: Secondary | ICD-10-CM | POA: Diagnosis not present

## 2021-10-11 MED ORDER — PAROXETINE HCL 40 MG PO TABS: 40.0000 mg | ORAL_TABLET | ORAL | 1 refills | Status: DC

## 2021-10-11 MED ORDER — AMOXICILLIN 875 MG PO TABS: 875.0000 mg | ORAL_TABLET | Freq: Two times a day (BID) | ORAL | 0 refills | Status: AC

## 2021-10-11 NOTE — Patient Instructions (Signed)

## 2021-10-11 NOTE — Telephone Encounter (Signed)
Please review.  KP

## 2021-10-11 NOTE — Assessment & Plan Note (Signed)
Persistent Increase Paroxetine to 40 mg daily Referral placed to psychology to see if we can get her in with someone in her network She is actively applying for new jobs Work note provided to return on 2/7 Support offered

## 2021-10-11 NOTE — Progress Notes (Signed)
Virtual Visit via Video Note  I connected with Denise Estrada on 10/11/21 at 11:20 AM EST by a video enabled telemedicine application and verified that I am speaking with the correct person using two identifiers.  Location: Patient: Home Provider: Office  Persons participating in this video call: Webb Silversmith, NP and Maren Reamer   I discussed the limitations of evaluation and management by telemedicine and the availability of in person appointments. The patient expressed understanding and agreed to proceed.  History of Present Illness:  Patient due to follow-up anxiety and depression.  She reports persistent symptoms and does feel like this has gotten worse lately.  She reports her coworker somehow found out that she was out of work for "mental issues".  There is a lot of gossip going around work that she has been hospitalized in a mental institution.  She also reports that her mother is getting kicked out of her uncles house and she will have to take her in.  She reports her and her mother have had past issues and do not get along well.  She feels obligated to take her mother in.  She is currently taking Paroxetine as prescribed which reports it has helped some but could be better.  She was referred to a therapist however they were not accepting new patients.  She does feel like she would benefit from some therapy at this time.  Her FMLA ends 2/7 and she will need a note to return to work at that time.  She has been applying for other jobs.  She also reports eye crusting, nasal congestion and sore throat.  She reports this started 1 week ago.  She did an E-visit for the same and was prescribed a nasal spray, cough tablets and cough syrup.  She denies headache, runny nose, ear pain, cough, shortness of breath, chest pain, nausea, vomiting or diarrhea.  She denies fever, chills or body aches.  She has not taken a home COVID test.    Past Medical History:  Diagnosis Date   Depression    GERD  (gastroesophageal reflux disease)    Hyperlipidemia    Ulcer of esophagus     Current Outpatient Medications  Medication Sig Dispense Refill   benzonatate (TESSALON) 100 MG capsule Take 1 capsule (100 mg total) by mouth 3 (three) times daily as needed. 30 capsule 0   ipratropium (ATROVENT) 0.03 % nasal spray Place 2 sprays into both nostrils every 12 (twelve) hours. 30 mL 0   lidocaine (XYLOCAINE) 2 % solution 36mL swish and swallow every 4 hours as needed for sore throat 100 mL 0   PARoxetine (PAXIL) 20 MG tablet Take 1 tablet (20 mg total) by mouth every morning. 90 tablet 1   No current facility-administered medications for this visit.    Allergies  Allergen Reactions   Erythromycin Base Anaphylaxis   Lansoprazole Nausea Only   Sulfamethoxazole-Trimethoprim Nausea And Vomiting   Tramadol Nausea And Vomiting    Family History  Problem Relation Age of Onset   Heart disease Mother    Stroke Mother    Diabetes Mother    Depression Mother    Cancer Father        lung   Heart disease Father    Stroke Father    Diabetes Father    Lung cancer Father    Heart attack Brother    Diabetes Brother    Hypertension Brother    Diabetes Son  type 1   Cancer Maternal Grandmother    Hypertension Maternal Grandmother    Breast cancer Maternal Grandmother        unsure late 40's?   Stroke Maternal Grandfather    Heart attack Maternal Grandfather    Diabetes Maternal Grandfather    Diabetes Paternal Grandmother     Social History   Socioeconomic History   Marital status: Divorced    Spouse name: Not on file   Number of children: Not on file   Years of education: Not on file   Highest education level: Not on file  Occupational History   Not on file  Tobacco Use   Smoking status: Every Day    Packs/day: 1.00    Years: 20.00    Pack years: 20.00    Types: Cigarettes   Smokeless tobacco: Never   Tobacco comments:    3/4 pack for most of 20 years  Vaping Use    Vaping Use: Every day   Substances: Nicotine, Flavoring  Substance and Sexual Activity   Alcohol use: No   Drug use: No   Sexual activity: Yes    Birth control/protection: None  Other Topics Concern   Not on file  Social History Narrative   Not on file   Social Determinants of Health   Financial Resource Strain: Not on file  Food Insecurity: Not on file  Transportation Needs: Not on file  Physical Activity: Not on file  Stress: Not on file  Social Connections: Not on file  Intimate Partner Violence: Not on file     Constitutional: Denies fever, malaise, fatigue, headache or abrupt weight changes.  HEENT: Patient reports eye discharge, nasal congestion and sore throat.  Denies eye pain, eye redness, ear pain, ringing in the ears, wax buildup, runny nose, bloody nose. Respiratory: Denies difficulty breathing, shortness of breath, cough or sputum production.   Cardiovascular: Denies chest pain, chest tightness, palpitations or swelling in the hands or feet.  Gastrointestinal: Denies abdominal pain, bloating, constipation, diarrhea or blood in the stool.  Neurological: Denies dizziness, difficulty with memory, difficulty with speech or problems with balance and coordination.  Psych: Patient has a history of anxiety and depression.  Denies SI/HI.  No other specific complaints in a complete review of systems (except as listed in HPI above).  Observations/Objective:  LMP 03/25/2017 (Approximate) Comment: June or July 2018 last period [place] Readings from Last 3 Encounters:  09/22/21 147 lb 3.2 oz (66.8 kg)  08/11/21 147 lb 9.6 oz (67 kg)  04/01/20 150 lb 3.2 oz (68.1 kg)    General: Appears her stated age, in NAD. Skin: Warm, dry and intact.  EENT: Eyes: No scleral injection noted; no crusting of the eyes noted however the video is slightly blurred.  Nose: Congestion noted; Throat: Hoarseness noted Pulmonary/Chest: Normal effort. No respiratory distress.  Neurological: Alert and  oriented.  Psychiatric: Tearful   BMET    Component Value Date/Time   NA 139 09/22/2021 1032   K 4.3 09/22/2021 1032   CL 105 09/22/2021 1032   CO2 26 09/22/2021 1032   GLUCOSE 96 09/22/2021 1032   BUN 8 09/22/2021 1032   CREATININE 0.76 09/22/2021 1032   CALCIUM 9.4 09/22/2021 1032   GFRNONAA 104 04/01/2020 0931   GFRAA 120 04/01/2020 0931    Lipid Panel     Component Value Date/Time   CHOL 175 09/22/2021 1032   TRIG 142 09/22/2021 1032   HDL 49 (L) 09/22/2021 1032   CHOLHDL 3.6 09/22/2021  1032   VLDL 19 07/17/2016 0800   LDLCALC 102 (H) 09/22/2021 1032    CBC    Component Value Date/Time   WBC 6.4 09/22/2021 1032   RBC 4.96 09/22/2021 1032   HGB 15.5 09/22/2021 1032   HCT 46.6 (H) 09/22/2021 1032   PLT 185 09/22/2021 1032   MCV 94.0 09/22/2021 1032   MCH 31.3 09/22/2021 1032   MCHC 33.3 09/22/2021 1032   RDW 13.3 09/22/2021 1032   LYMPHSABS 2,208 04/01/2020 0931   EOSABS 83 04/01/2020 0931   BASOSABS 33 04/01/2020 0931    Hgb A1C Lab Results  Component Value Date   HGBA1C 5.7 (H) 09/22/2021       Assessment and Plan:  Eye Drainage, Nasal Congestion and Sore Throat  Advised her to use warm compresses 3 times daily Advised her to use the nasal spray that was prescribed and start Allegra OTC Advised her to take a home COVID test and let me know the results when she can  We will follow-up after results of home COVID test with further recommendation and treatment plan Follow Up Instructions:    I discussed the assessment and treatment plan with the patient. The patient was provided an opportunity to ask questions and all were answered. The patient agreed with the plan and demonstrated an understanding of the instructions.   The patient was advised to call back or seek an in-person evaluation if the symptoms worsen or if the condition fails to improve as anticipated.   Webb Silversmith, NP

## 2021-10-20 ENCOUNTER — Ambulatory Visit: Payer: Medicaid Other | Admitting: Internal Medicine

## 2021-10-26 NOTE — Telephone Encounter (Signed)
I filled out this form the other day and placed it in my outbox. Have you seen it? Is it up front anywhere?

## 2021-10-27 NOTE — Telephone Encounter (Signed)
Were you able to get this form emailed?

## 2021-11-09 ENCOUNTER — Telehealth (INDEPENDENT_AMBULATORY_CARE_PROVIDER_SITE_OTHER): Payer: Medicaid Other | Admitting: Internal Medicine

## 2021-11-09 DIAGNOSIS — F419 Anxiety disorder, unspecified: Secondary | ICD-10-CM

## 2021-11-09 DIAGNOSIS — F32A Depression, unspecified: Secondary | ICD-10-CM | POA: Diagnosis not present

## 2021-11-09 MED ORDER — BUPROPION HCL ER (XL) 150 MG PO TB24: 150.0000 mg | ORAL_TABLET | Freq: Every day | ORAL | 2 refills | Status: DC

## 2021-11-09 NOTE — Progress Notes (Signed)
Virtual Visit via Video Note  I connected with Denise Estrada on 11/09/21 at 11:20 AM EST by a video enabled telemedicine application and verified that I am speaking with the correct person using two identifiers.  Location: Patient: Home Provider: Office  Persons participating in this video call: Denise Reaper, NP and Margarette Canada.   I discussed the limitations of evaluation and management by telemedicine and the availability of in person appointments. The patient expressed understanding and agreed to proceed.  History of Present Illness:  Patient due for follow-up of anxiety and depression.  This is a chronic issue related to work-related stress, family stress and financial strain.  She reports she thinks she has been let go from her job.  She is taking Paroxetine as prescribed but does not feel it is effective as it once was.  She has been referred to a therapist but has not had her first appointment yet.  She denies SI/HI.   Past Medical History:  Diagnosis Date   Depression    GERD (gastroesophageal reflux disease)    Hyperlipidemia    Ulcer of esophagus     Current Outpatient Medications  Medication Sig Dispense Refill   lidocaine (XYLOCAINE) 2 % solution 62mL swish and swallow every 4 hours as needed for sore throat 100 mL 0   PARoxetine (PAXIL) 40 MG tablet Take 1 tablet (40 mg total) by mouth every morning. 90 tablet 1   No current facility-administered medications for this visit.    Allergies  Allergen Reactions   Erythromycin Base Anaphylaxis   Lansoprazole Nausea Only   Sulfamethoxazole-Trimethoprim Nausea And Vomiting   Tramadol Nausea And Vomiting    Family History  Problem Relation Age of Onset   Heart disease Mother    Stroke Mother    Diabetes Mother    Depression Mother    Cancer Father        lung   Heart disease Father    Stroke Father    Diabetes Father    Lung cancer Father    Heart attack Brother    Diabetes Brother    Hypertension Brother     Diabetes Son        type 1   Cancer Maternal Grandmother    Hypertension Maternal Grandmother    Breast cancer Maternal Grandmother        unsure late 46's?   Stroke Maternal Grandfather    Heart attack Maternal Grandfather    Diabetes Maternal Grandfather    Diabetes Paternal Grandmother     Social History   Socioeconomic History   Marital status: Divorced    Spouse name: Not on file   Number of children: Not on file   Years of education: Not on file   Highest education level: Not on file  Occupational History   Not on file  Tobacco Use   Smoking status: Every Day    Packs/day: 1.00    Years: 20.00    Pack years: 20.00    Types: Cigarettes   Smokeless tobacco: Never   Tobacco comments:    3/4 pack for most of 20 years  Vaping Use   Vaping Use: Every day   Substances: Nicotine, Flavoring  Substance and Sexual Activity   Alcohol use: No   Drug use: No   Sexual activity: Yes    Birth control/protection: None  Other Topics Concern   Not on file  Social History Narrative   Not on file   Social Determinants of Health  Financial Resource Strain: Not on file  Food Insecurity: Not on file  Transportation Needs: Not on file  Physical Activity: Not on file  Stress: Not on file  Social Connections: Not on file  Intimate Partner Violence: Not on file     Constitutional: Denies fever, malaise, fatigue, headache or abrupt weight changes.  Respiratory: Denies difficulty breathing, shortness of breath, cough or sputum production.   Cardiovascular: Denies chest pain, chest tightness, palpitations or swelling in the hands or feet.  Neurological: Denies dizziness, difficulty with memory, difficulty with speech or problems with balance and coordination.  Psych: Patient has a history of anxiety and depression.  Denies SI/HI.  No other specific complaints in a complete review of systems (except as listed in HPI above).    Observations/Objective:  LMP 03/25/2017  (Approximate) Comment: June or July 2018 last period [place] Readings from Last 3 Encounters:  09/22/21 147 lb 3.2 oz (66.8 kg)  08/11/21 147 lb 9.6 oz (67 kg)  04/01/20 150 lb 3.2 oz (68.1 kg)    General: Appears her stated age, well developed, well nourished in NAD. Pulmonary/Chest: Normal effort. No respiratory distress.  Neurological: Alert and oriented.   Psychiatric: Mood and affect normal.  Anxious appearing.  Judgment and thought content normal.    BMET    Component Value Date/Time   NA 139 09/22/2021 1032   K 4.3 09/22/2021 1032   CL 105 09/22/2021 1032   CO2 26 09/22/2021 1032   GLUCOSE 96 09/22/2021 1032   BUN 8 09/22/2021 1032   CREATININE 0.76 09/22/2021 1032   CALCIUM 9.4 09/22/2021 1032   GFRNONAA 104 04/01/2020 0931   GFRAA 120 04/01/2020 0931    Lipid Panel     Component Value Date/Time   CHOL 175 09/22/2021 1032   TRIG 142 09/22/2021 1032   HDL 49 (L) 09/22/2021 1032   CHOLHDL 3.6 09/22/2021 1032   VLDL 19 07/17/2016 0800   LDLCALC 102 (H) 09/22/2021 1032    CBC    Component Value Date/Time   WBC 6.4 09/22/2021 1032   RBC 4.96 09/22/2021 1032   HGB 15.5 09/22/2021 1032   HCT 46.6 (H) 09/22/2021 1032   PLT 185 09/22/2021 1032   MCV 94.0 09/22/2021 1032   MCH 31.3 09/22/2021 1032   MCHC 33.3 09/22/2021 1032   RDW 13.3 09/22/2021 1032   LYMPHSABS 2,208 04/01/2020 0931   EOSABS 83 04/01/2020 0931   BASOSABS 33 04/01/2020 0931    Hgb A1C Lab Results  Component Value Date   HGBA1C 5.7 (H) 09/22/2021        Assessment and Plan:   Follow Up Instructions:    I discussed the assessment and treatment plan with the patient. The patient was provided an opportunity to ask questions and all were answered. The patient agreed with the plan and demonstrated an understanding of the instructions.   The patient was advised to call back or seek an in-person evaluation if the symptoms worsen or if the condition fails to improve as  anticipated.    Denise Reaper, NP

## 2021-11-10 ENCOUNTER — Encounter: Payer: Self-pay | Admitting: Internal Medicine

## 2021-11-10 NOTE — Assessment & Plan Note (Signed)
Deteriorated Continue Paroxetine 40 mg daily We will add Wellbutrin 150 mg daily Advised her if she has not heard from a therapist in 2 weeks about an appointment to let me know Support offered

## 2021-11-10 NOTE — Patient Instructions (Signed)

## 2021-11-22 ENCOUNTER — Encounter: Payer: Self-pay | Admitting: Internal Medicine
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
## 2021-11-29 ENCOUNTER — Telehealth (INDEPENDENT_AMBULATORY_CARE_PROVIDER_SITE_OTHER): Payer: Medicaid Other | Admitting: Psychiatry

## 2021-11-29 ENCOUNTER — Encounter: Payer: Self-pay | Admitting: Psychiatry

## 2021-11-29 ENCOUNTER — Other Ambulatory Visit: Payer: Self-pay

## 2021-11-29 DIAGNOSIS — F331 Major depressive disorder, recurrent, moderate: Secondary | ICD-10-CM

## 2021-11-29 DIAGNOSIS — F4310 Post-traumatic stress disorder, unspecified: Secondary | ICD-10-CM

## 2021-11-29 MED ORDER — SERTRALINE HCL 50 MG PO TABS: ORAL_TABLET | ORAL | 0 refills | Status: DC

## 2021-11-29 NOTE — Patient Instructions (Signed)
Start sertraline 25 mg at night for one week, then 50 mg at night ?Obtain lab (TSH) ?Next appointment- 4/7 at 8 AM  ?

## 2021-11-30 DIAGNOSIS — F331 Major depressive disorder, recurrent, moderate: Secondary | ICD-10-CM | POA: Diagnosis not present

## 2021-12-01 ENCOUNTER — Encounter: Payer: Self-pay | Admitting: Psychiatry

## 2021-12-01 LAB — TSH: TSH: 1.32 u[IU]/mL (ref 0.450–4.500)

## 2021-12-08 ENCOUNTER — Other Ambulatory Visit: Payer: Self-pay | Admitting: Internal Medicine

## 2021-12-13 ENCOUNTER — Telehealth (INDEPENDENT_AMBULATORY_CARE_PROVIDER_SITE_OTHER): Payer: Medicaid Other | Admitting: Internal Medicine

## 2021-12-13 ENCOUNTER — Encounter: Payer: Self-pay | Admitting: Internal Medicine

## 2021-12-13 DIAGNOSIS — F32A Depression, unspecified: Secondary | ICD-10-CM | POA: Diagnosis not present

## 2021-12-13 DIAGNOSIS — F419 Anxiety disorder, unspecified: Secondary | ICD-10-CM | POA: Diagnosis not present

## 2021-12-13 NOTE — Progress Notes (Signed)
Virtual Visit via Video Note ? ?I connected with Denise Estrada on 12/13/21 at 11:20 AM EDT by a video enabled telemedicine application and verified that I am speaking with the correct person using two identifiers. ? ?Location: ?Patient: Home ?Provider: Office ? ?Persons participating in this video call: [name], NP and Margarette Canada ?  ?I discussed the limitations of evaluation and management by telemedicine and the availability of in person appointments. The patient expressed understanding and agreed to proceed. ? ?History of Present Illness: ? ?Patient due for follow-up of anxiety and depression.  This has been a chronic issue triggered by work, family stress and financial strain.  She is currently taking Sertraline as prescribed but reports it makes her feel numb.  She has an appt with a therapist in April. She denies SI/HI. ? ? ?Past Medical History:  ?Diagnosis Date  ? Depression   ? GERD (gastroesophageal reflux disease)   ? Hyperlipidemia   ? Ulcer of esophagus   ? ? ?Current Outpatient Medications  ?Medication Sig Dispense Refill  ? sertraline (ZOLOFT) 50 MG tablet 25 mg at night for one week, then 50 mg at night 30 tablet 0  ? ?No current facility-administered medications for this visit.  ? ? ?Allergies  ?Allergen Reactions  ? Erythromycin Base Anaphylaxis  ? Lansoprazole Nausea Only  ? Sulfamethoxazole-Trimethoprim Nausea And Vomiting  ? Tramadol Nausea And Vomiting  ? ? ?Family History  ?Problem Relation Age of Onset  ? Heart disease Mother   ? Stroke Mother   ? Diabetes Mother   ? Depression Mother   ? Cancer Father   ?     lung  ? Heart disease Father   ? Stroke Father   ? Diabetes Father   ? Lung cancer Father   ? Heart attack Brother   ? Diabetes Brother   ? Hypertension Brother   ? Diabetes Son   ?     type 1  ? Cancer Maternal Grandmother   ? Hypertension Maternal Grandmother   ? Breast cancer Maternal Grandmother   ?     unsure late 46's?  ? Stroke Maternal Grandfather   ? Heart attack Maternal  Grandfather   ? Diabetes Maternal Grandfather   ? Diabetes Paternal Grandmother   ? ? ?Social History  ? ?Socioeconomic History  ? Marital status: Divorced  ?  Spouse name: Not on file  ? Number of children: Not on file  ? Years of education: Not on file  ? Highest education level: Not on file  ?Occupational History  ? Not on file  ?Tobacco Use  ? Smoking status: Every Day  ?  Packs/day: 1.00  ?  Years: 20.00  ?  Pack years: 20.00  ?  Types: Cigarettes  ? Smokeless tobacco: Never  ? Tobacco comments:  ?  3/4 pack for most of 20 years  ?Vaping Use  ? Vaping Use: Every day  ? Substances: Nicotine, Flavoring  ?Substance and Sexual Activity  ? Alcohol use: No  ? Drug use: No  ? Sexual activity: Yes  ?  Birth control/protection: None  ?Other Topics Concern  ? Not on file  ?Social History Narrative  ? Not on file  ? ?Social Determinants of Health  ? ?Financial Resource Strain: Not on file  ?Food Insecurity: Not on file  ?Transportation Needs: Not on file  ?Physical Activity: Not on file  ?Stress: Not on file  ?Social Connections: Not on file  ?Intimate Partner Violence: Not on file  ? ? ? ?  Constitutional: Denies fever, malaise, fatigue, headache or abrupt weight changes.  ?Respiratory: Denies difficulty breathing, shortness of breath, cough or sputum production.   ?Cardiovascular: Denies chest pain, chest tightness, palpitations or swelling in the hands or feet.  ?Neurological: Denies dizziness, difficulty with memory, difficulty with speech or problems with balance and coordination.  ?Psych: Pt reports anxiety and depression. Denies SI/HI. ? ?No other specific complaints in a complete review of systems (except as listed in HPI above). ? ?  ?Observations/Objective: ?LMP 03/25/2017 (Approximate) Comment: June or July 2018 last period ?[place] Readings from Last 3 Encounters:  ?09/22/21 147 lb 3.2 oz (66.8 kg)  ?08/11/21 147 lb 9.6 oz (67 kg)  ?04/01/20 150 lb 3.2 oz (68.1 kg)  ? ? ?General: Appears her stated age, well  developed, well nourished in NAD. ?Pulmonary/Chest: Normal effort. No respiratory distress. ?Neurological: Alert and oriented.  ?Psychiatric: Mood and affect normal. Mildly anxious appearing. Judgment and thought content normal.  ? ? ?BMET ?   ?Component Value Date/Time  ? NA 139 09/22/2021 1032  ? K 4.3 09/22/2021 1032  ? CL 105 09/22/2021 1032  ? CO2 26 09/22/2021 1032  ? GLUCOSE 96 09/22/2021 1032  ? BUN 8 09/22/2021 1032  ? CREATININE 0.76 09/22/2021 1032  ? CALCIUM 9.4 09/22/2021 1032  ? GFRNONAA 104 04/01/2020 0931  ? GFRAA 120 04/01/2020 0931  ? ? ?Lipid Panel  ?   ?Component Value Date/Time  ? CHOL 175 09/22/2021 1032  ? TRIG 142 09/22/2021 1032  ? HDL 49 (L) 09/22/2021 1032  ? CHOLHDL 3.6 09/22/2021 1032  ? VLDL 19 07/17/2016 0800  ? LDLCALC 102 (H) 09/22/2021 1032  ? ? ?CBC ?   ?Component Value Date/Time  ? WBC 6.4 09/22/2021 1032  ? RBC 4.96 09/22/2021 1032  ? HGB 15.5 09/22/2021 1032  ? HCT 46.6 (H) 09/22/2021 1032  ? PLT 185 09/22/2021 1032  ? MCV 94.0 09/22/2021 1032  ? MCH 31.3 09/22/2021 1032  ? MCHC 33.3 09/22/2021 1032  ? RDW 13.3 09/22/2021 1032  ? LYMPHSABS 2,208 04/01/2020 0931  ? EOSABS 83 04/01/2020 0931  ? BASOSABS 33 04/01/2020 0931  ? ? ?Hgb A1C ?Lab Results  ?Component Value Date  ? HGBA1C 5.7 (H) 09/22/2021  ? ? ? ? ? ? ?Assessment and Plan: ? ?RTC in 3 months for your annual exam ? ?Follow Up Instructions: ? ?  ?I discussed the assessment and treatment plan with the patient. The patient was provided an opportunity to ask questions and all were answered. The patient agreed with the plan and demonstrated an understanding of the instructions. ?  ?The patient was advised to call back or seek an in-person evaluation if the symptoms worsen or if the condition fails to improve as anticipated. ? ? ? ?[name], NP ? ?

## 2021-12-13 NOTE — Assessment & Plan Note (Signed)
Improved on Sertraline ?She will continue to see psychiatry and keep her appt with the therapist ?Support offered ?

## 2021-12-13 NOTE — Patient Instructions (Signed)
Managing Anxiety, Adult ?After being diagnosed with anxiety, you may be relieved to know why you have felt or behaved a certain way. You may also feel overwhelmed about the treatment ahead and what it will mean for your life. With care and support, you can manage this condition. ?How to manage lifestyle changes ?Managing stress and anxiety ?Stress is your body's reaction to life changes and events, both good and bad. Most stress will last just a few hours, but stress can be ongoing and can lead to more than just stress. Although stress can play a major role in anxiety, it is not the same as anxiety. Stress is usually caused by something external, such as a deadline, test, or competition. Stress normally passes after the triggering event has ended.  ?Anxiety is caused by something internal, such as imagining a terrible outcome or worrying that something will go wrong that will devastate you. Anxiety often does not go away even after the triggering event is over, and it can become long-term (chronic) worry. It is important to understand the differences between stress and anxiety and to manage your stress effectively so that it does not lead to an anxious response. ?Talk with your health care provider or a counselor to learn more about reducing anxiety and stress. He or she may suggest tension reduction techniques, such as: ?Music therapy. Spend time creating or listening to music that you enjoy and that inspires you. ?Mindfulness-based meditation. Practice being aware of your normal breaths while not trying to control your breathing. It can be done while sitting or walking. ?Centering prayer. This involves focusing on a word, phrase, or sacred image that means something to you and brings you peace. ?Deep breathing. To do this, expand your stomach and inhale slowly through your nose. Hold your breath for 3-5 seconds. Then exhale slowly, letting your stomach muscles relax. ?Self-talk. Learn to notice and identify  thought patterns that lead to anxiety reactions and change those patterns to thoughts that feel peaceful. ?Muscle relaxation. Taking time to tense muscles and then relax them. ?Choose a tension reduction technique that fits your lifestyle and personality. These techniques take time and practice. Set aside 5-15 minutes a day to do them. Therapists can offer counseling and training in these techniques. The training to help with anxiety may be covered by some insurance plans. ?Other things you can do to manage stress and anxiety include: ?Keeping a stress diary. This can help you learn what triggers your reaction and then learn ways to manage your response. ?Thinking about how you react to certain situations. You may not be able to control everything, but you can control your response. ?Making time for activities that help you relax and not feeling guilty about spending your time in this way. ?Doing visual imagery. This involves imagining or creating mental pictures to help you relax. ?Practicing yoga. Through yoga poses, you can lower tension and promote relaxation. ? ?Medicines ?Medicines can help ease symptoms. Medicines for anxiety include: ?Antidepressant medicines. These are usually prescribed for long-term daily control. ?Anti-anxiety medicines. These may be added in severe cases, especially when panic attacks occur. ?Medicines will be prescribed by a health care provider. When used together, medicines, psychotherapy, and tension reduction techniques may be the most effective treatment. ?Relationships ?Relationships can play a big part in helping you recover. Try to spend more time connecting with trusted friends and family members. ?Consider going to couples counseling if you have a partner, taking family education classes, or going to family   therapy. ?Therapy can help you and others better understand your condition. ?How to recognize changes in your anxiety ?Everyone responds differently to treatment for  anxiety. Recovery from anxiety happens when symptoms decrease and stop interfering with your daily activities at home or work. This may mean that you will start to: ?Have better concentration and focus. Worry will interfere less in your daily thinking. ?Sleep better. ?Be less irritable. ?Have more energy. ?Have improved memory. ?It is also important to recognize when your condition is getting worse. Contact your health care provider if your symptoms interfere with home or work and you feel like your condition is not improving. ?Follow these instructions at home: ?Activity ?Exercise. Adults should do the following: ?Exercise for at least 150 minutes each week. The exercise should increase your heart rate and make you sweat (moderate-intensity exercise). ?Strengthening exercises at least twice a week. ?Get the right amount and quality of sleep. Most adults need 7-9 hours of sleep each night. ?Lifestyle ? ?Eat a healthy diet that includes plenty of vegetables, fruits, whole grains, low-fat dairy products, and lean protein. ?Do not eat a lot of foods that are high in fats, added sugars, or salt (sodium). ?Make choices that simplify your life. ?Do not use any products that contain nicotine or tobacco. These products include cigarettes, chewing tobacco, and vaping devices, such as e-cigarettes. If you need help quitting, ask your health care provider. ?Avoid caffeine, alcohol, and certain over-the-counter cold medicines. These may make you feel worse. Ask your pharmacist which medicines to avoid. ?General instructions ?Take over-the-counter and prescription medicines only as told by your health care provider. ?Keep all follow-up visits. This is important. ?Where to find support ?You can get help and support from these sources: ?Self-help groups. ?Online and community organizations. ?A trusted spiritual leader. ?Couples counseling. ?Family education classes. ?Family therapy. ?Where to find more information ?You may find  that joining a support group helps you deal with your anxiety. The following sources can help you locate counselors or support groups near you: ?Mental Health America: www.mentalhealthamerica.net ?Anxiety and Depression Association of America (ADAA): www.adaa.org ?National Alliance on Mental Illness (NAMI): www.nami.org ?Contact a health care provider if: ?You have a hard time staying focused or finishing daily tasks. ?You spend many hours a day feeling worried about everyday life. ?You become exhausted by worry. ?You start to have headaches or frequently feel tense. ?You develop chronic nausea or diarrhea. ?Get help right away if: ?You have a racing heart and shortness of breath. ?You have thoughts of hurting yourself or others. ?If you ever feel like you may hurt yourself or others, or have thoughts about taking your own life, get help right away. Go to your nearest emergency department or: ?Call your local emergency services (911 in the [place] ?Call a suicide crisis helpline, such as the National Suicide Prevention Lifeline at 1-800-273-8255 or 988 in the [place] This is open 24 hours a day in the [place] ?Text the Crisis Text Line at 741741 (in the [place] ?Summary ?Taking steps to learn and use tension reduction techniques can help calm you and help prevent triggering an anxiety reaction. ?When used together, medicines, psychotherapy, and tension reduction techniques may be the most effective treatment. ?Family, friends, and partners can play a big part in supporting you. ?This information is not intended to replace advice given to you by your health care provider. Make sure you discuss any questions you have with your health care provider. ?Document Revised: 04/06/2021 Document Reviewed: 01/02/2021 ?Elsevier Patient   Education ? 2022 [employer] ? ?

## 2021-12-26 ENCOUNTER — Ambulatory Visit (INDEPENDENT_AMBULATORY_CARE_PROVIDER_SITE_OTHER): Payer: Medicaid Other | Admitting: Licensed Clinical Social Worker

## 2021-12-26 DIAGNOSIS — F4310 Post-traumatic stress disorder, unspecified: Secondary | ICD-10-CM | POA: Diagnosis not present

## 2021-12-26 NOTE — Plan of Care (Signed)
Developed treatment plan utilizing pt input ?
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
## 2021-12-30 ENCOUNTER — Encounter: Payer: Self-pay | Admitting: Psychiatry

## 2021-12-30 ENCOUNTER — Telehealth (INDEPENDENT_AMBULATORY_CARE_PROVIDER_SITE_OTHER): Payer: Medicaid Other | Admitting: Psychiatry

## 2021-12-30 DIAGNOSIS — F331 Major depressive disorder, recurrent, moderate: Secondary | ICD-10-CM | POA: Diagnosis not present

## 2021-12-30 DIAGNOSIS — F4310 Post-traumatic stress disorder, unspecified: Secondary | ICD-10-CM

## 2021-12-30 MED ORDER — SERTRALINE HCL 50 MG PO TABS: 50.0000 mg | ORAL_TABLET | Freq: Every day | ORAL | 0 refills | Status: DC

## 2021-12-30 NOTE — Patient Instructions (Signed)
Increase sertraline 50 mg at night ?Next appointment- 6/1 at 10 AM, in person ? ?The next visit will be in person visit. Please arrive 15 mins before the scheduled time.  ? ?[place]  ?Address: [address], [place], [place] [address]   ?

## 2022-01-13 ENCOUNTER — Ambulatory Visit
Admission: RE | Admit: 2022-01-13 | Discharge: 2022-01-13 | Disposition: A | Discharge status: Home / Self Care | Payer: Medicaid Other | Source: Ambulatory Visit | Attending: Internal Medicine | Admitting: Internal Medicine

## 2022-01-13 DIAGNOSIS — Z1231 Encounter for screening mammogram for malignant neoplasm of breast: Secondary | ICD-10-CM | POA: Diagnosis not present

## 2022-02-06 ENCOUNTER — Ambulatory Visit: Payer: Medicaid Other | Admitting: Licensed Clinical Social Worker
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
## 2022-02-23 ENCOUNTER — Ambulatory Visit: Payer: Medicaid Other | Admitting: Psychiatry

## 2022-07-18 ENCOUNTER — Telehealth: Payer: Medicaid Other | Admitting: Family Medicine

## 2022-07-18 DIAGNOSIS — R3989 Other symptoms and signs involving the genitourinary system: Secondary | ICD-10-CM

## 2022-07-18 MED ORDER — PHENAZOPYRIDINE HCL 200 MG PO TABS: 200.0000 mg | ORAL_TABLET | Freq: Three times a day (TID) | ORAL | 0 refills | Status: DC | PRN

## 2022-07-18 MED ORDER — NITROFURANTOIN MONOHYD MACRO 100 MG PO CAPS
100.0000 mg | ORAL_CAPSULE | Freq: Two times a day (BID) | ORAL | 0 refills | Status: AC
Start: 2022-07-18 — End: 2022-07-23

## 2022-07-18 NOTE — Patient Instructions (Addendum)
Denise Estrada, thank you for joining Perlie Mayo, NP for today's virtual visit.  While this provider is not your primary care provider (PCP), if your PCP is located in our provider database this encounter information will be shared with them immediately following your visit.   [facility] account gives you access to today's visit and all your visits, tests, and labs performed at [facility] " click here if you don't have a [facility] account or go to [email or web address]  Consent: (Patient) Denise Estrada provided verbal consent for this virtual visit at the beginning of the encounter.  Current Medications:  Current Outpatient Medications:    nitrofurantoin, macrocrystal-monohydrate, (MACROBID) 100 MG capsule, Take 1 capsule (100 mg total) by mouth 2 (two) times daily for 5 days., Disp: 10 capsule, Rfl: 0   phenazopyridine (PYRIDIUM) 200 MG tablet, Take 1 tablet (200 mg total) by mouth 3 (three) times daily as needed for pain., Disp: 10 tablet, Rfl: 0   sertraline (ZOLOFT) 50 MG tablet, Take 1 tablet (50 mg total) by mouth daily., Disp: 90 tablet, Rfl: 0   Medications ordered in this encounter:  Meds ordered this encounter  Medications   nitrofurantoin, macrocrystal-monohydrate, (MACROBID) 100 MG capsule    Sig: Take 1 capsule (100 mg total) by mouth 2 (two) times daily for 5 days.    Dispense:  10 capsule    Refill:  0    Order Specific Question:   Supervising Provider    Answer:   Chase Picket [[id number]]   phenazopyridine (PYRIDIUM) 200 MG tablet    Sig: Take 1 tablet (200 mg total) by mouth 3 (three) times daily as needed for pain.    Dispense:  10 tablet    Refill:  0    Order Specific Question:   Supervising Provider    Answer:   Chase Picket [id number]     *If you need refills on other medications prior to your next appointment, please contact your pharmacy*  Follow-Up: Call back or seek an in-person evaluation if the symptoms  worsen or if the condition fails to improve as anticipated.  [facility] [phone]  Other Instructions Urinary Tract Infection, Adult A urinary tract infection (UTI) is an infection of any part of the urinary tract. The urinary tract includes: The kidneys. The ureters. The bladder. The urethra. These organs make, store, and get rid of pee (urine) in the body. What are the causes? This infection is caused by germs (bacteria) in your genital area. These germs grow and cause swelling (inflammation) of your urinary tract. What increases the risk? The following factors may make you more likely to develop this condition: Using a small, thin tube (catheter) to drain pee. Not being able to control when you pee or poop (incontinence). Being female. If you are female, these things can increase the risk: Using these methods to prevent pregnancy: A medicine that kills sperm (spermicide). A device that blocks sperm (diaphragm). Having low levels of a female hormone (estrogen). Being pregnant. You are more likely to develop this condition if: You have genes that add to your risk. You are sexually active. You take antibiotic medicines. You have trouble peeing because of: A prostate that is bigger than normal, if you are female. A blockage in the part of your body that drains pee from the bladder. A kidney stone. A nerve condition that affects your bladder. Not getting enough to drink. Not peeing often enough. You  have other conditions, such as: Diabetes. A weak disease-fighting system (immune system). Sickle cell disease. Gout. Injury of the spine. What are the signs or symptoms? Symptoms of this condition include: Needing to pee right away. Peeing small amounts often. Pain or burning when peeing. Blood in the pee. Pee that smells bad or not like normal. Trouble peeing. Pee that is cloudy. Fluid coming from the vagina, if you are female. Pain in the belly or lower  back. Other symptoms include: Vomiting. Not feeling hungry. Feeling mixed up (confused). This may be the first symptom in older adults. Being tired and grouchy (irritable). A fever. Watery poop (diarrhea). How is this treated? Taking antibiotic medicine. Taking other medicines. Drinking enough water. In some cases, you may need to see a specialist. Follow these instructions at home:  Medicines Take over-the-counter and prescription medicines only as told by your doctor. If you were prescribed an antibiotic medicine, take it as told by your doctor. Do not stop taking it even if you start to feel better. General instructions Make sure you: Pee until your bladder is empty. Do not hold pee for a long time. Empty your bladder after sex. Wipe from front to back after peeing or pooping if you are a female. Use each tissue one time when you wipe. Drink enough fluid to keep your pee pale yellow. Keep all follow-up visits. Contact a doctor if: You do not get better after 1-2 days. Your symptoms go away and then come back. Get help right away if: You have very bad back pain. You have very bad pain in your lower belly. You have a fever. You have chills. You feeling like you will vomit or you vomit. Summary A urinary tract infection (UTI) is an infection of any part of the urinary tract. This condition is caused by germs in your genital area. There are many risk factors for a UTI. Treatment includes antibiotic medicines. Drink enough fluid to keep your pee pale yellow. This information is not intended to replace advice given to you by your health care provider. Make sure you discuss any questions you have with your health care provider. Document Revised: 04/23/2020 Document Reviewed: 04/23/2020 Elsevier Patient Education  [place].    If you have been instructed to have an in-person evaluation today at a local Urgent Care facility, please use the link below. It will take  you to a list of all of our available [facility], including address, phone number and hours of operation. Please do not delay care.  [facility]  If you or a family member do not have a primary care provider, use the link below to schedule a visit and establish care. When you choose a [facility] primary care physician or advanced practice provider, you gain a long-term partner in health. Find a Primary Care Provider  Learn more about [facility]'s in-office and virtual care options: [facility]

## 2022-07-18 NOTE — Progress Notes (Signed)
Virtual Visit Consent   Denise Estrada, you are scheduled for a virtual visit with a [facility] provider today. Just as with appointments in the office, your consent must be obtained to participate. Your consent will be active for this visit and any virtual visit you may have with one of our providers in the next 365 days. If you have a MyChart account, a copy of this consent can be sent to you electronically.  As this is a virtual visit, video technology does not allow for your provider to perform a traditional examination. This may limit your provider's ability to fully assess your condition. If your provider identifies any concerns that need to be evaluated in person or the need to arrange testing (such as labs, EKG, etc.), we will make arrangements to do so. Although advances in technology are sophisticated, we cannot ensure that it will always work on either your end or our end. If the connection with a video visit is poor, the visit may have to be switched to a telephone visit. With either a video or telephone visit, we are not always able to ensure that we have a secure connection.  By engaging in this virtual visit, you consent to the provision of healthcare and authorize for your insurance to be billed (if applicable) for the services provided during this visit. Depending on your insurance coverage, you may receive a charge related to this service.  I need to obtain your verbal consent now. Are you willing to proceed with your visit today? Denise Estrada has provided verbal consent on 07/18/2022 for a virtual visit (video or telephone). Perlie Mayo, NP  Date: 07/18/2022 2:36 PM  Virtual Visit via Video Note   I, Perlie Mayo, connected with  Denise Estrada  ([id number], [birth year]) on 07/18/22 at  2:45 PM EDT by a video-enabled telemedicine application and verified that I am speaking with the correct person using two identifiers.  Location: Patient: Virtual Visit Location Patient:  Home Provider: Virtual Visit Location Provider: Home Office   I discussed the limitations of evaluation and management by telemedicine and the availability of in person appointments. The patient expressed understanding and agreed to proceed.    History of Present Illness: Denise Estrada is a 51 y.o. who identifies as a female who was assigned female at birth, and is being seen today for home UTI test is +.  HPI: Urinary Tract Infection  This is a new problem. The current episode started in the past 7 days. The problem occurs every urination. The problem has been gradually worsening. The pain is at a severity of 5/10 (lower pelvic). There has been no fever. She is Not sexually active. There is No history of pyelonephritis. Associated symptoms include frequency, hesitancy and urgency. Pertinent negatives include no chills, discharge, flank pain, hematuria, nausea, possible pregnancy, sweats or vomiting. She has tried increased fluids (cranberry juice) for the symptoms. The treatment provided mild relief.    Problems:  Patient Active Problem List   Diagnosis Date Noted   Polycythemia 08/11/2021   Prediabetes 08/11/2021   Chronic pain syndrome 01/08/2017   Anxiety and depression 09/09/2015   COPD (chronic obstructive pulmonary disease) ([place]) 09/09/2015   GERD (gastroesophageal reflux disease) 09/09/2015   Hyperlipidemia 09/09/2015    Allergies:  Allergies  Allergen Reactions   Erythromycin Base Anaphylaxis   Lansoprazole Nausea Only   Sulfamethoxazole-Trimethoprim Nausea And Vomiting   Tramadol Nausea And Vomiting   Medications:  Current Outpatient Medications:  sertraline (ZOLOFT) 50 MG tablet, Take 1 tablet (50 mg total) by mouth daily., Disp: 90 tablet, Rfl: 0  Observations/Objective: Patient is well-developed, well-nourished in no acute distress.  Resting comfortably  at home.  Head is normocephalic, atraumatic.  No labored breathing.  Speech is clear and coherent with logical  content.  Patient is alert and oriented at baseline.    Assessment and Plan:  1. Suspected UTI  - nitrofurantoin, macrocrystal-monohydrate, (MACROBID) 100 MG capsule; Take 1 capsule (100 mg total) by mouth 2 (two) times daily for 5 days.  Dispense: 10 capsule; Refill: 0 - phenazopyridine (PYRIDIUM) 200 MG tablet; Take 1 tablet (200 mg total) by mouth 3 (three) times daily as needed for pain.  Dispense: 10 tablet; Refill: 0  -hydrate -review of good hygiene to help prevent -above meds as ordered, info on avs   Reviewed side effects, risks and benefits of medication.    Patient acknowledged agreement and understanding of the plan.   Past Medical, Surgical, Social History, Allergies, and Medications have been Reviewed.   Follow Up Instructions: I discussed the assessment and treatment plan with the patient. The patient was provided an opportunity to ask questions and all were answered. The patient agreed with the plan and demonstrated an understanding of the instructions.  A copy of instructions were sent to the patient via MyChart unless otherwise noted below.     The patient was advised to call back or seek an in-person evaluation if the symptoms worsen or if the condition fails to improve as anticipated.  Time:  I spent 7 minutes with the patient via telehealth technology discussing the above problems/concerns.    Perlie Mayo, NP

## 2022-08-01 ENCOUNTER — Ambulatory Visit: Payer: Medicaid Other | Admitting: Internal Medicine

## 2022-08-01 ENCOUNTER — Encounter: Payer: Self-pay | Admitting: Internal Medicine

## 2022-08-01 VITALS — BP 104/62 | HR 86 | Temp 96.8°F | Ht 64.5 in | Wt 143.0 lb

## 2022-08-01 DIAGNOSIS — F419 Anxiety disorder, unspecified: Secondary | ICD-10-CM | POA: Diagnosis not present

## 2022-08-01 DIAGNOSIS — K219 Gastro-esophageal reflux disease without esophagitis: Secondary | ICD-10-CM

## 2022-08-01 DIAGNOSIS — F32A Depression, unspecified: Secondary | ICD-10-CM | POA: Diagnosis not present

## 2022-08-01 DIAGNOSIS — N3090 Cystitis, unspecified without hematuria: Secondary | ICD-10-CM

## 2022-08-01 DIAGNOSIS — J432 Centrilobular emphysema: Secondary | ICD-10-CM

## 2022-08-01 DIAGNOSIS — E782 Mixed hyperlipidemia: Secondary | ICD-10-CM

## 2022-08-01 DIAGNOSIS — G894 Chronic pain syndrome: Secondary | ICD-10-CM | POA: Diagnosis not present

## 2022-08-01 DIAGNOSIS — R7303 Prediabetes: Secondary | ICD-10-CM | POA: Diagnosis not present

## 2022-08-01 DIAGNOSIS — D751 Secondary polycythemia: Secondary | ICD-10-CM

## 2022-08-01 DIAGNOSIS — R300 Dysuria: Secondary | ICD-10-CM | POA: Diagnosis not present

## 2022-08-01 LAB — POCT URINALYSIS DIPSTICK
Bilirubin, UA: NEGATIVE
Blood, UA: NEGATIVE
Glucose, UA: NEGATIVE
Ketones, UA: NEGATIVE
Leukocytes, UA: NEGATIVE
Nitrite, UA: NEGATIVE
Protein, UA: NEGATIVE
Spec Grav, UA: <=1.005 — AB (ref 1.010–1.025)
Urobilinogen, UA: 0.2 E.U./dL
pH, UA: 7.5 (ref 5.0–8.0)

## 2022-08-01 MED ORDER — BUSPIRONE HCL 5 MG PO TABS: 5.0000 mg | ORAL_TABLET | Freq: Two times a day (BID) | ORAL | 1 refills | Status: DC

## 2022-08-01 MED ORDER — METAXALONE 800 MG PO TABS: 800.0000 mg | ORAL_TABLET | Freq: Two times a day (BID) | ORAL | 1 refills | Status: DC | PRN

## 2022-08-01 NOTE — Progress Notes (Signed)
Subjective:    Patient ID: Denise Estrada, female    DOB: 1970/10/17, 51 y.o.   MRN: [id number]  HPI  Patient presents to clinic today for follow-up of chronic conditions.  Anxiety and Depression: Chronic, she is not currently taking any medications for this.  She is not currently seeing a therapist but has in the past but did not feel like it was helpful.  She denies SI/HI.  COPD: She denies chronic cough or shortness of breath.  She is not using any inhalers at this time.  She does continue to smoke.  There are no PFTs on file.  Chronic Pain: Mainly in her spine.  She takes Tylenol as needed with some relief of symptoms.  She would like to take get restarted on Skelaxin. She does not follow with pain management.  GERD: Triggered by acidic food. She takes Tums, as needed with good relief of symptoms.  There is no upper GI on file.  HLD: Her last LDL was 102, triglycerides 142, 08/2021.  She is not taking any cholesterol-lowering medication at this time.  She tries to consume a low-fat diet.  Polycythemia: Her last H/H was 15.5/46.6, 08/2021.  She does smoke.  She does not follow with hematology.  Prediabetes: Her last A1c was 5.7%, 08/2021.  She is not taking any oral diabetic medications time.  She does not check her sugars.  She would also like to be rechecked for UTI.  She recently completed antibiotics 1 week ago and would like to make sure her infection is gone.  She is not currently having any symptoms.  Review of Systems  Past Medical History:  Diagnosis Date   Depression    GERD (gastroesophageal reflux disease)    Hyperlipidemia    Ulcer of esophagus     Current Outpatient Medications  Medication Sig Dispense Refill   phenazopyridine (PYRIDIUM) 200 MG tablet Take 1 tablet (200 mg total) by mouth 3 (three) times daily as needed for pain. 10 tablet 0   sertraline (ZOLOFT) 50 MG tablet Take 1 tablet (50 mg total) by mouth daily. 90 tablet 0   No current  facility-administered medications for this visit.    Allergies  Allergen Reactions   Erythromycin Base Anaphylaxis   Lansoprazole Nausea Only   Sulfamethoxazole-Trimethoprim Nausea And Vomiting   Tramadol Nausea And Vomiting    Family History  Problem Relation Age of Onset   Heart disease Mother    Stroke Mother    Diabetes Mother    Depression Mother    Cancer Father        lung   Heart disease Father    Stroke Father    Diabetes Father    Lung cancer Father    Heart attack Brother    Diabetes Brother    Hypertension Brother    Diabetes Son        type 1   Cancer Maternal Grandmother    Hypertension Maternal Grandmother    Breast cancer Maternal Grandmother        unsure late 9's?   Stroke Maternal Grandfather    Heart attack Maternal Grandfather    Diabetes Maternal Grandfather    Diabetes Paternal Grandmother     Social History   Socioeconomic History   Marital status: Divorced    Spouse name: Not on file   Number of children: Not on file   Years of education: Not on file   Highest education level: Not on file  Occupational History  Not on file  Tobacco Use   Smoking status: Every Day    Packs/day: 1.00    Years: 20.00    Total pack years: 20.00    Types: Cigarettes   Smokeless tobacco: Never   Tobacco comments:    3/4 pack for most of 20 years  Vaping Use   Vaping Use: Every day   Substances: Nicotine, Flavoring  Substance and Sexual Activity   Alcohol use: No   Drug use: No   Sexual activity: Yes    Birth control/protection: None  Other Topics Concern   Not on file  Social History Narrative   Not on file   Social Determinants of Health   Financial Resource Strain: Not on file  Food Insecurity: Not on file  Transportation Needs: Not on file  Physical Activity: Not on file  Stress: Not on file  Social Connections: Not on file  Intimate Partner Violence: Not on file     Constitutional: Denies fever, malaise, fatigue, headache or  abrupt weight changes.  HEENT: Denies eye pain, eye redness, ear pain, ringing in the ears, wax buildup, runny nose, nasal congestion, bloody nose, or sore throat. Respiratory: Denies difficulty breathing, shortness of breath, cough or sputum production.   Cardiovascular: Denies chest pain, chest tightness, palpitations or swelling in the hands or feet.  Gastrointestinal: Patient reports intermittent reflux.  Denies abdominal pain, bloating, constipation, diarrhea or blood in the stool.  GU: Denies urgency, frequency, pain with urination, burning sensation, blood in urine, odor or discharge. Musculoskeletal: Patient reports chronic back pain.  Denies decrease in range of motion, difficulty with gait, or joint swelling.  Skin: Denies redness, rashes, lesions or ulcercations.  Neurological: Denies dizziness, difficulty with memory, difficulty with speech or problems with balance and coordination.  Psych: Patient has a history of anxiety and depression.  Denies SI/HI.  No other specific complaints in a complete review of systems (except as listed in HPI above).     Objective:   Physical Exam  BP 104/62 (BP Location: Left Arm, Patient Position: Sitting, Cuff Size: Normal)   Pulse 86   Temp (!) 96.8 F (36 C) (Temporal)   Ht 5' 4.5" (1.638 m)   Wt 143 lb (64.9 kg)   LMP 03/16/2017 (Approximate)   SpO2 100%   BMI 24.17 kg/m   Wt Readings from Last 3 Encounters:  09/22/21 147 lb 3.2 oz (66.8 kg)  08/11/21 147 lb 9.6 oz (67 kg)  04/01/20 150 lb 3.2 oz (68.1 kg)    General: Appears her stated age, well developed, well nourished in NAD. Skin: Warm, dry and intact.  HEENT: Head: normal shape and size; Eyes: sclera white, no icterus, conjunctiva pink, PERRLA and EOMs intact;  Cardiovascular: Normal rate and rhythm. S1,S2 noted.  No murmur, rubs or gallops noted.  Pulmonary/Chest: Normal effort and positive vesicular breath sounds. No respiratory distress. No wheezes, rales or ronchi noted.   Abdomen: Soft and nontender. Normal bowel sounds.  Musculoskeletal:  No difficulty with gait.  Neurological: Alert and oriented.  Psychiatric: Mood and affect normal.  Mildly anxious appearing. Judgment and thought content normal.    BMET    Component Value Date/Time   NA 139 09/22/2021 1032   K 4.3 09/22/2021 1032   CL 105 09/22/2021 1032   CO2 26 09/22/2021 1032   GLUCOSE 96 09/22/2021 1032   BUN 8 09/22/2021 1032   CREATININE 0.76 09/22/2021 1032   CALCIUM 9.4 09/22/2021 1032   GFRNONAA 104 04/01/2020 0931  GFRAA 120 04/01/2020 0931    Lipid Panel     Component Value Date/Time   CHOL 175 09/22/2021 1032   TRIG 142 09/22/2021 1032   HDL 49 (L) 09/22/2021 1032   CHOLHDL 3.6 09/22/2021 1032   VLDL 19 07/17/2016 0800   LDLCALC 102 (H) 09/22/2021 1032    CBC    Component Value Date/Time   WBC 6.4 09/22/2021 1032   RBC 4.96 09/22/2021 1032   HGB 15.5 09/22/2021 1032   HCT 46.6 (H) 09/22/2021 1032   PLT 185 09/22/2021 1032   MCV 94.0 09/22/2021 1032   MCH 31.3 09/22/2021 1032   MCHC 33.3 09/22/2021 1032   RDW 13.3 09/22/2021 1032   LYMPHSABS 2,208 04/01/2020 0931   EOSABS 83 04/01/2020 0931   BASOSABS 33 04/01/2020 0931    Hgb A1C Lab Results  Component Value Date   HGBA1C 5.7 (H) 09/22/2021           Assessment & Plan:   UTI:   Urinalysis: 2+ leuks We will send urine culture Push fluids  RTC in 6 months for your annual exam [name], NP

## 2022-08-01 NOTE — Patient Instructions (Signed)

## 2022-08-01 NOTE — Assessment & Plan Note (Signed)
Encourage smoking cessation Not currently using any inhalers

## 2022-08-01 NOTE — Assessment & Plan Note (Addendum)
CBC today Encourage smoking cessation 

## 2022-08-01 NOTE — Assessment & Plan Note (Signed)
Deteriorated Failed SSRI therapy  She is not interested in referral for therapist at this time We will try BuSpar 5 mg twice daily as needed Support offered

## 2022-08-01 NOTE — Assessment & Plan Note (Signed)
Encourage low-carb diet A1c today

## 2022-08-01 NOTE — Assessment & Plan Note (Signed)
C-Met and lipid profile today Encouraged her to consume a low-fat diet 

## 2022-08-01 NOTE — Assessment & Plan Note (Signed)
Continue Tylenol We will add Skelaxin Encourage regular stretching and core strengthening

## 2022-08-01 NOTE — Assessment & Plan Note (Signed)
Avoid foods that trigger reflux Okay to continue Tums as needed

## 2022-08-02 LAB — COMPLETE METABOLIC PANEL WITH GFR
AG Ratio: 1.8 (calc) (ref 1.0–2.5)
ALT: 20 U/L (ref 6–29)
AST: 15 U/L (ref 10–35)
Albumin: 4.6 g/dL (ref 3.6–5.1)
Alkaline phosphatase (APISO): 82 U/L (ref 37–153)
BUN: 10 mg/dL (ref 7–25)
CO2: 29 mmol/L (ref 20–32)
Calcium: 9.7 mg/dL (ref 8.6–10.4)
Chloride: 102 mmol/L (ref 98–110)
Creat: 0.76 mg/dL (ref 0.50–1.03)
Globulin: 2.6 g/dL (calc) (ref 1.9–3.7)
Glucose, Bld: 99 mg/dL (ref 65–99)
Potassium: 4.2 mmol/L (ref 3.5–5.3)
Sodium: 141 mmol/L (ref 135–146)
Total Bilirubin: 0.5 mg/dL (ref 0.2–1.2)
Total Protein: 7.2 g/dL (ref 6.1–8.1)
eGFR: 94 mL/min/{1.73_m2} (ref >=60)

## 2022-08-02 LAB — CBC
HCT: 45.7 % — ABNORMAL HIGH (ref 35.0–45.0)
Hemoglobin: 15.4 g/dL (ref 11.7–15.5)
MCH: 31.2 pg (ref 27.0–33.0)
MCHC: 33.7 g/dL (ref 32.0–36.0)
MCV: 92.5 fL (ref 80.0–100.0)
MPV: 11.7 fL (ref 7.5–12.5)
Platelets: 245 10*3/uL (ref 140–400)
RBC: 4.94 10*6/uL (ref 3.80–5.10)
RDW: 12.7 % (ref 11.0–15.0)
WBC: 6.7 10*3/uL (ref 3.8–10.8)

## 2022-08-02 LAB — URINE CULTURE
MICRO NUMBER:: 14155745
SPECIMEN QUALITY:: ADEQUATE

## 2022-08-02 LAB — LIPID PANEL
Cholesterol: 183 mg/dL (ref <200)
HDL: 46 mg/dL — ABNORMAL LOW (ref >=50)
LDL Cholesterol (Calc): 116 mg/dL (calc) — ABNORMAL HIGH
Non-HDL Cholesterol (Calc): 137 mg/dL (calc) — ABNORMAL HIGH (ref <130)
Total CHOL/HDL Ratio: 4 (calc) (ref <5.0)
Triglycerides: 107 mg/dL (ref <150)

## 2022-08-02 LAB — HEMOGLOBIN A1C
Hgb A1c MFr Bld: 6.1 % of total Hgb — ABNORMAL HIGH (ref <5.7)
Mean Plasma Glucose: 128 mg/dL
eAG (mmol/L): 7.1 mmol/L

## 2022-09-27 ENCOUNTER — Encounter: Payer: Medicaid Other | Admitting: Internal Medicine

## 2022-09-27 NOTE — Progress Notes (Deleted)
Subjective:    Patient ID: Denise Estrada, female    DOB: Aug 03, 1971, 52 y.o.   MRN: [id number]  HPI  Patient presents to clinic today for her annual exam.  Flu: Tetanus: COVID: Shingrix: Pap smear: 08/2018 Mammogram: 12/2021 Colon screening: 04/2020, Cologuard Vision screening: Dentist:  Diet: Exercise:  Review of Systems     Past Medical History:  Diagnosis Date   Depression    GERD (gastroesophageal reflux disease)    Hyperlipidemia    Ulcer of esophagus     Current Outpatient Medications  Medication Sig Dispense Refill   busPIRone (BUSPAR) 5 MG tablet Take 1 tablet (5 mg total) by mouth 2 (two) times daily. 180 tablet 1   metaxalone (SKELAXIN) 800 MG tablet Take 1 tablet (800 mg total) by mouth 2 (two) times daily as needed for muscle spasms. 180 tablet 1   No current facility-administered medications for this visit.    Allergies  Allergen Reactions   Erythromycin Base Anaphylaxis   Lansoprazole Nausea Only   Sulfamethoxazole-Trimethoprim Nausea And Vomiting   Tramadol Nausea And Vomiting    Family History  Problem Relation Age of Onset   Heart disease Mother    Stroke Mother    Diabetes Mother    Depression Mother    Cancer Father        lung   Heart disease Father    Stroke Father    Diabetes Father    Lung cancer Father    Heart attack Brother    Diabetes Brother    Hypertension Brother    Diabetes Son        type 1   Cancer Maternal Grandmother    Hypertension Maternal Grandmother    Breast cancer Maternal Grandmother        unsure late 70's?   Stroke Maternal Grandfather    Heart attack Maternal Grandfather    Diabetes Maternal Grandfather    Diabetes Paternal Grandmother     Social History   Socioeconomic History   Marital status: Divorced    Spouse name: Not on file   Number of children: Not on file   Years of education: Not on file   Highest education level: Not on file  Occupational History   Not on file  Tobacco Use    Smoking status: Every Day    Packs/day: 1.00    Years: 20.00    Total pack years: 20.00    Types: Cigarettes   Smokeless tobacco: Never   Tobacco comments:    3/4 pack for most of 20 years  Vaping Use   Vaping Use: Every day   Substances: Nicotine, Flavoring  Substance and Sexual Activity   Alcohol use: No   Drug use: No   Sexual activity: Yes    Birth control/protection: None  Other Topics Concern   Not on file  Social History Narrative   Not on file   Social Determinants of Health   Financial Resource Strain: Not on file  Food Insecurity: Not on file  Transportation Needs: Not on file  Physical Activity: Not on file  Stress: Not on file  Social Connections: Not on file  Intimate Partner Violence: Not on file     Constitutional: Denies fever, malaise, fatigue, headache or abrupt weight changes.  HEENT: Denies eye pain, eye redness, ear pain, ringing in the ears, wax buildup, runny nose, nasal congestion, bloody nose, or sore throat. Respiratory: Denies difficulty breathing, shortness of breath, cough or sputum production.   Cardiovascular:  Denies chest pain, chest tightness, palpitations or swelling in the hands or feet.  Gastrointestinal: Denies abdominal pain, bloating, constipation, diarrhea or blood in the stool.  GU: Denies urgency, frequency, pain with urination, burning sensation, blood in urine, odor or discharge. Musculoskeletal: Patient reports chronic joint muscle pain.  Denies decrease in range of motion, difficulty with gait, or joint swelling.  Skin: Denies redness, rashes, lesions or ulcercations.  Neurological: Denies dizziness, difficulty with memory, difficulty with speech or problems with balance and coordination.  Psych: Patient has a history of anxiety and depression.  Denies SI/HI.  No other specific complaints in a complete review of systems (except as listed in HPI above).  Objective:   Physical Exam   LMP 03/16/2017 (Approximate)  Wt  Readings from Last 3 Encounters:  08/01/22 143 lb (64.9 kg)  09/22/21 147 lb 3.2 oz (66.8 kg)  08/11/21 147 lb 9.6 oz (67 kg)    General: Appears their stated age, well developed, well nourished in NAD. Skin: Warm, dry and intact. No rashes, lesions or ulcerations noted. HEENT: Head: normal shape and size; Eyes: sclera white, no icterus, conjunctiva pink, PERRLA and EOMs intact; Ears: Tm's gray and intact, normal light reflex; Nose: mucosa pink and moist, septum midline; Throat/Mouth: Teeth present, mucosa pink and moist, no exudate, lesions or ulcerations noted.  Neck:  Neck supple, trachea midline. No masses, lumps or thyromegaly present.  Cardiovascular: Normal rate and rhythm. S1,S2 noted.  No murmur, rubs or gallops noted. No JVD or BLE edema. No carotid bruits noted. Pulmonary/Chest: Normal effort and positive vesicular breath sounds. No respiratory distress. No wheezes, rales or ronchi noted.  Abdomen: Soft and nontender. Normal bowel sounds. No distention or masses noted. Liver, spleen and kidneys non palpable. Musculoskeletal: Normal range of motion. No signs of joint swelling. No difficulty with gait.  Neurological: Alert and oriented. Cranial nerves II-XII grossly intact. Coordination normal.  Psychiatric: Mood and affect normal. Behavior is normal. Judgment and thought content normal.    BMET    Component Value Date/Time   NA 141 08/01/2022 1018   K 4.2 08/01/2022 1018   CL 102 08/01/2022 1018   CO2 29 08/01/2022 1018   GLUCOSE 99 08/01/2022 1018   BUN 10 08/01/2022 1018   CREATININE 0.76 08/01/2022 1018   CALCIUM 9.7 08/01/2022 1018   GFRNONAA 104 04/01/2020 0931   GFRAA 120 04/01/2020 0931    Lipid Panel     Component Value Date/Time   CHOL 183 08/01/2022 1018   TRIG 107 08/01/2022 1018   HDL 46 (L) 08/01/2022 1018   CHOLHDL 4.0 08/01/2022 1018   VLDL 19 07/17/2016 0800   LDLCALC 116 (H) 08/01/2022 1018    CBC    Component Value Date/Time   WBC 6.7  08/01/2022 1018   RBC 4.94 08/01/2022 1018   HGB 15.4 08/01/2022 1018   HCT 45.7 (H) 08/01/2022 1018   PLT 245 08/01/2022 1018   MCV 92.5 08/01/2022 1018   MCH 31.2 08/01/2022 1018   MCHC 33.7 08/01/2022 1018   RDW 12.7 08/01/2022 1018   LYMPHSABS 2,208 04/01/2020 0931   EOSABS 83 04/01/2020 0931   BASOSABS 33 04/01/2020 0931    Hgb A1C Lab Results  Component Value Date   HGBA1C 6.1 (H) 08/01/2022           Assessment & Plan:   Preventative Health Maintenance:  She declines flu shot She declines tetanus booster Encouraged her to get her COVID-vaccine Discussed Shingrix vaccine, she will check  coverage with her insurance company and schedule a nurse visit if she would like to have this done Pap smear Mammogram ordered-she will call to schedule Cologuard ordered Encouraged her to consume a balanced diet and exercise regimen Advised her to see an eye doctor and dentist annually We will check CBC, c-Met, lipid profile today  RTC in 6 months, follow-up chronic condition Webb Silversmith, NP

## 2022-10-02 ENCOUNTER — Encounter: Payer: Self-pay | Admitting: Internal Medicine

## 2022-11-16 ENCOUNTER — Encounter: Payer: Medicaid Other | Admitting: Internal Medicine

## 2022-12-05 ENCOUNTER — Ambulatory Visit: Payer: Medicaid Other | Admitting: Internal Medicine

## 2022-12-05 ENCOUNTER — Encounter: Payer: Self-pay | Admitting: Internal Medicine

## 2022-12-05 VITALS — BP 136/72 | HR 68 | Temp 96.8°F | Ht 65.0 in | Wt 144.0 lb

## 2022-12-05 DIAGNOSIS — Z1231 Encounter for screening mammogram for malignant neoplasm of breast: Secondary | ICD-10-CM

## 2022-12-05 DIAGNOSIS — Z1211 Encounter for screening for malignant neoplasm of colon: Secondary | ICD-10-CM

## 2022-12-05 DIAGNOSIS — E782 Mixed hyperlipidemia: Secondary | ICD-10-CM | POA: Diagnosis not present

## 2022-12-05 DIAGNOSIS — M546 Pain in thoracic spine: Secondary | ICD-10-CM

## 2022-12-05 DIAGNOSIS — M25512 Pain in left shoulder: Secondary | ICD-10-CM

## 2022-12-05 DIAGNOSIS — R7303 Prediabetes: Secondary | ICD-10-CM

## 2022-12-05 DIAGNOSIS — Z0001 Encounter for general adult medical examination with abnormal findings: Secondary | ICD-10-CM

## 2022-12-05 NOTE — Progress Notes (Signed)
Subjective:    Patient ID: Denise Estrada, female    DOB: [birth year], 52 y.o.   MRN: [id number]  HPI  Patient presents to clinic today for her annual exam.  Flu: never Tetanus:< 10 years ago COVID: never Shingrix: never Pap smear: 08/2018 Mammogram: 12/2021 Colon screening: 04/2023 Vision screening: annually Dentist: as needed, dentures  Diet: She does eat meat. She consumes some fruits, more veggies. She tries to avoid fried foods. She drinks mostly water, Sprite, espresso. Exercise: Walking  Review of Systems  Past Medical History:  Diagnosis Date   Depression    GERD (gastroesophageal reflux disease)    Hyperlipidemia    Ulcer of esophagus     Current Outpatient Medications  Medication Sig Dispense Refill   busPIRone (BUSPAR) 5 MG tablet Take 1 tablet (5 mg total) by mouth 2 (two) times daily. 180 tablet 1   metaxalone (SKELAXIN) 800 MG tablet Take 1 tablet (800 mg total) by mouth 2 (two) times daily as needed for muscle spasms. 180 tablet 1   No current facility-administered medications for this visit.    Allergies  Allergen Reactions   Erythromycin Base Anaphylaxis   Lansoprazole Nausea Only   Sulfamethoxazole-Trimethoprim Nausea And Vomiting   Tramadol Nausea And Vomiting    Family History  Problem Relation Age of Onset   Heart disease Mother    Stroke Mother    Diabetes Mother    Depression Mother    Cancer Father        lung   Heart disease Father    Stroke Father    Diabetes Father    Lung cancer Father    Heart attack Brother    Diabetes Brother    Hypertension Brother    Diabetes Son        type 1   Cancer Maternal Grandmother    Hypertension Maternal Grandmother    Breast cancer Maternal Grandmother        unsure late 22's?   Stroke Maternal Grandfather    Heart attack Maternal Grandfather    Diabetes Maternal Grandfather    Diabetes Paternal Grandmother     Social History   Socioeconomic History   Marital status: Divorced     Spouse name: Not on file   Number of children: Not on file   Years of education: Not on file   Highest education level: Not on file  Occupational History   Not on file  Tobacco Use   Smoking status: Every Day    Packs/day: 1.00    Years: 20.00    Total pack years: 20.00    Types: Cigarettes   Smokeless tobacco: Never   Tobacco comments:    3/4 pack for most of 20 years  Vaping Use   Vaping Use: Every day   Substances: Nicotine, Flavoring  Substance and Sexual Activity   Alcohol use: No   Drug use: No   Sexual activity: Yes    Birth control/protection: None  Other Topics Concern   Not on file  Social History Narrative   Not on file   Social Determinants of Health   Financial Resource Strain: Not on file  Food Insecurity: Not on file  Transportation Needs: Not on file  Physical Activity: Not on file  Stress: Not on file  Social Connections: Not on file  Intimate Partner Violence: Not on file     Constitutional: Denies fever, malaise, fatigue, headache or abrupt weight changes.  HEENT: Denies eye pain, eye redness, ear pain,  ringing in the ears, wax buildup, runny nose, nasal congestion, bloody nose, or sore throat. Respiratory: Denies difficulty breathing, shortness of breath, cough or sputum production.   Cardiovascular: Denies chest pain, chest tightness, palpitations or swelling in the hands or feet.  Gastrointestinal: Denies abdominal pain, bloating, constipation, diarrhea or blood in the stool.  GU: Denies urgency, frequency, pain with urination, burning sensation, blood in urine, odor or discharge. Musculoskeletal: Patient reports left side back pain, left shoulder pain.  Denies decrease in range of motion, difficulty with gait, muscle pain or joint swelling.  Skin: Denies redness, rashes, lesions or ulcercations.  Neurological: Denies dizziness, difficulty with memory, difficulty with speech or problems with balance and coordination.  Psych: Patient has a history  of anxiety and depression.  Denies SI/HI.  No other specific complaints in a complete review of systems (except as listed in HPI above).     Objective:   Physical Exam  BP 136/72 (BP Location: Left Arm, Patient Position: Sitting, Cuff Size: Normal)   Pulse 68   Temp (!) 96.8 F (36 C) (Temporal)   Ht '5\' 5"'$  (1.651 m)   Wt 144 lb (65.3 kg)   LMP 03/16/2017 (Approximate)   SpO2 99%   BMI 23.96 kg/m   Wt Readings from Last 3 Encounters:  08/01/22 143 lb (64.9 kg)  09/22/21 147 lb 3.2 oz (66.8 kg)  08/11/21 147 lb 9.6 oz (67 kg)    General: Appears her stated age, well developed, well nourished in NAD. Skin: Warm, dry and intact. No rashes noted. HEENT: Head: normal shape and size; Eyes: sclera white, no icterus, conjunctiva pink, PERRLA and EOMs intact;  Neck:  Neck supple, trachea midline. No masses, lumps or thyromegaly present.  Cardiovascular: Normal rate and rhythm. S1,S2 noted.  No murmur, rubs or gallops noted. No JVD or BLE edema. No carotid bruits noted. Pulmonary/Chest: Normal effort and positive vesicular breath sounds. No respiratory distress. No wheezes, rales or ronchi noted.  Abdomen: Soft and nontender. Normal bowel sounds. No CVA tenderness noted on the right. Musculoskeletal: Decreased external rotation of the left shoulder. Normal internal rotation of the left shoulder. Negative drop can test on the left. Pain with palpation of the left subacromial bursa. Strength 5/5 BUE/BLE. No difficulty with gait.  Neurological: Alert and oriented. Cranial nerves II-XII grossly intact. Coordination normal.  Psychiatric: Mood and affect normal. Behavior is normal. Judgment and thought content normal.    BMET    Component Value Date/Time   NA 141 08/01/2022 1018   K 4.2 08/01/2022 1018   CL 102 08/01/2022 1018   CO2 29 08/01/2022 1018   GLUCOSE 99 08/01/2022 1018   BUN 10 08/01/2022 1018   CREATININE 0.76 08/01/2022 1018   CALCIUM 9.7 08/01/2022 1018   GFRNONAA 104  04/01/2020 0931   GFRAA 120 04/01/2020 0931    Lipid Panel     Component Value Date/Time   CHOL 183 08/01/2022 1018   TRIG 107 08/01/2022 1018   HDL 46 (L) 08/01/2022 1018   CHOLHDL 4.0 08/01/2022 1018   VLDL 19 07/17/2016 0800   LDLCALC 116 (H) 08/01/2022 1018    CBC    Component Value Date/Time   WBC 6.7 08/01/2022 1018   RBC 4.94 08/01/2022 1018   HGB 15.4 08/01/2022 1018   HCT 45.7 (H) 08/01/2022 1018   PLT 245 08/01/2022 1018   MCV 92.5 08/01/2022 1018   MCH 31.2 08/01/2022 1018   MCHC 33.7 08/01/2022 1018   RDW 12.7 08/01/2022  [place] 04/01/2020 0931   EOSABS 83 04/01/2020 0931   BASOSABS 33 04/01/2020 0931    Hgb A1C Lab Results  Component Value Date   HGBA1C 6.1 (H) 08/01/2022           Assessment & Plan:   Preventative Health Maintenance:  Encouraged her to get a flu shot in the fall Tetanus UTD per her report Encouraged her to get her COVID-vaccine Discussed Shingrix vaccine, she will check coverage with her insurance company and schedule a visit if she would like to have this done Pap smear UTD Mammogram ordered-she will call to schedule Cologuard ordered Encouraged her to consume a balanced diet and exercise regimen Advised her to see an eye doctor and dentist annually We will check CBC, c-Met, lipid, A1c today  Left Shoulder Pain, Left Mid Back Pain:  No indication for xray at this time Likely subacromial bursitis Try Ibuprofen instead of Tylenol You have Metazolone to take if needed Avoid repetitive motions Encouraged stretching, heat and massage  RTC in 6 months, follow-up chronic conditions Webb Silversmith, NP

## 2022-12-05 NOTE — Patient Instructions (Signed)

## 2022-12-06 LAB — COMPLETE METABOLIC PANEL WITH GFR
AG Ratio: 1.8 (calc) (ref 1.0–2.5)
ALT: 35 U/L — ABNORMAL HIGH (ref 6–29)
AST: 23 U/L (ref 10–35)
Albumin: 4.4 g/dL (ref 3.6–5.1)
Alkaline phosphatase (APISO): 82 U/L (ref 37–153)
BUN: 9 mg/dL (ref 7–25)
CO2: 28 mmol/L (ref 20–32)
Calcium: 9.5 mg/dL (ref 8.6–10.4)
Chloride: 105 mmol/L (ref 98–110)
Creat: 0.81 mg/dL (ref 0.50–1.03)
Globulin: 2.4 g/dL (calc) (ref 1.9–3.7)
Glucose, Bld: 92 mg/dL (ref 65–139)
Potassium: 4.9 mmol/L (ref 3.5–5.3)
Sodium: 140 mmol/L (ref 135–146)
Total Bilirubin: 0.4 mg/dL (ref 0.2–1.2)
Total Protein: 6.8 g/dL (ref 6.1–8.1)
eGFR: 87 mL/min/{1.73_m2} (ref >=60)

## 2022-12-06 LAB — CBC
HCT: 44.4 % (ref 35.0–45.0)
Hemoglobin: 14.7 g/dL (ref 11.7–15.5)
MCH: 30.6 pg (ref 27.0–33.0)
MCHC: 33.1 g/dL (ref 32.0–36.0)
MCV: 92.3 fL (ref 80.0–100.0)
MPV: 11.4 fL (ref 7.5–12.5)
Platelets: 203 10*3/uL (ref 140–400)
RBC: 4.81 10*6/uL (ref 3.80–5.10)
RDW: 13 % (ref 11.0–15.0)
WBC: 5.3 10*3/uL (ref 3.8–10.8)

## 2022-12-06 LAB — LIPID PANEL
Cholesterol: 176 mg/dL (ref <200)
HDL: 51 mg/dL (ref >=50)
LDL Cholesterol (Calc): 104 mg/dL (calc) — ABNORMAL HIGH
Non-HDL Cholesterol (Calc): 125 mg/dL (calc) (ref <130)
Total CHOL/HDL Ratio: 3.5 (calc) (ref <5.0)
Triglycerides: 112 mg/dL (ref <150)

## 2022-12-06 LAB — HEMOGLOBIN A1C
Hgb A1c MFr Bld: 6 % of total Hgb — ABNORMAL HIGH (ref <5.7)
Mean Plasma Glucose: 126 mg/dL
eAG (mmol/L): 7 mmol/L

## 2022-12-21 IMAGING — MG MM DIGITAL SCREENING BILAT W/ TOMO AND CAD
8 series · 8 of 24 positions shown · non-contrast
Comparison: Previous exam(s).

CLINICAL DATA: Screening.

EXAM:
DIGITAL SCREENING BILATERAL MAMMOGRAM WITH TOMOSYNTHESIS AND CAD
TECHNIQUE: Bilateral screening digital craniocaudal and mediolateral oblique
mammograms were obtained. Bilateral screening digital breast
tomosynthesis was performed. The images were evaluated with
computer-aided detection.

[R CC synth-2D]
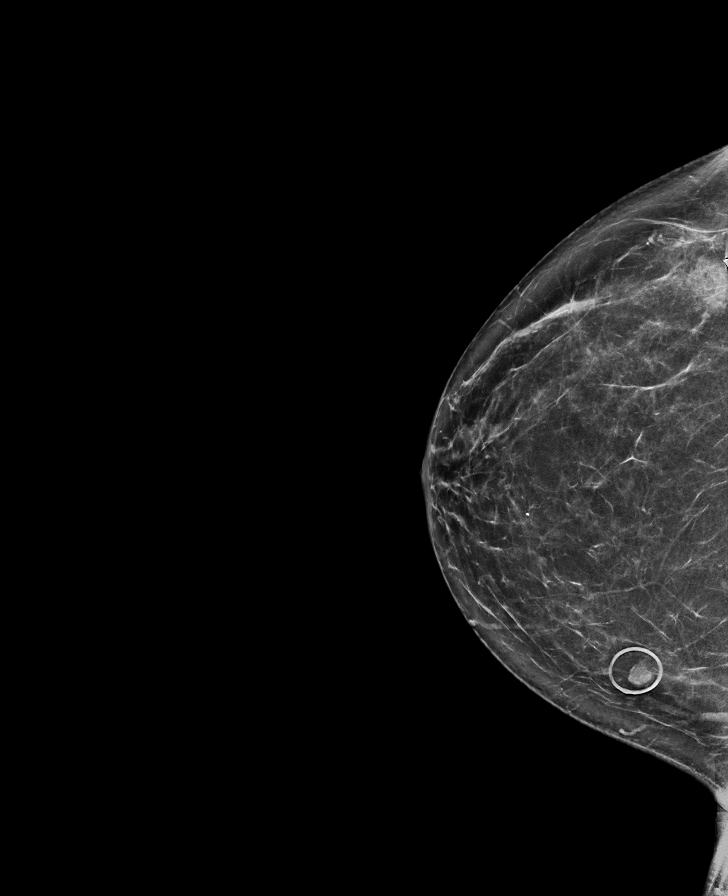

[L MLO synth-2D]
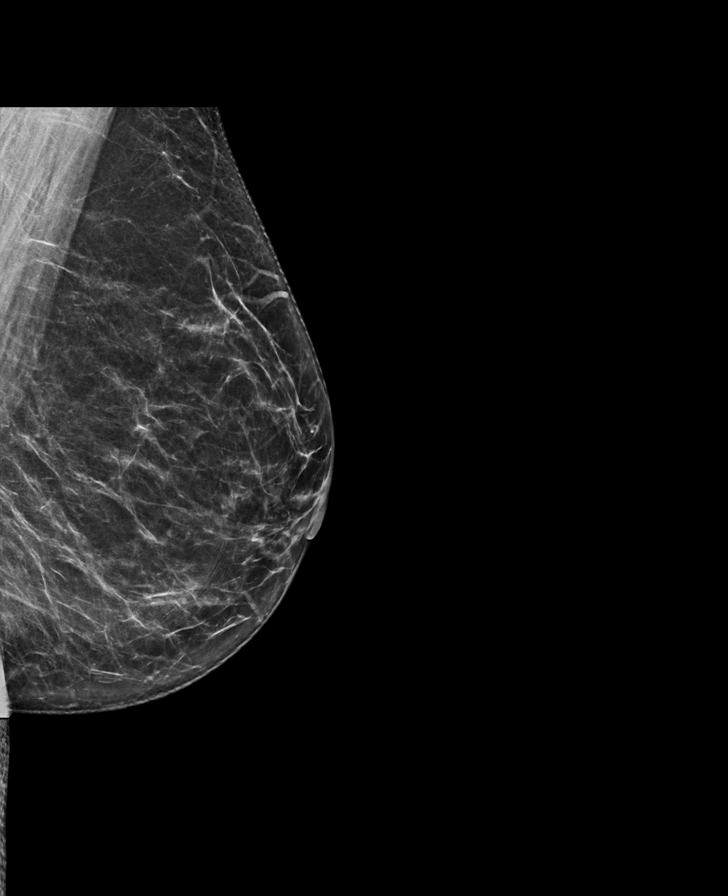

[R MLO synth-2D]
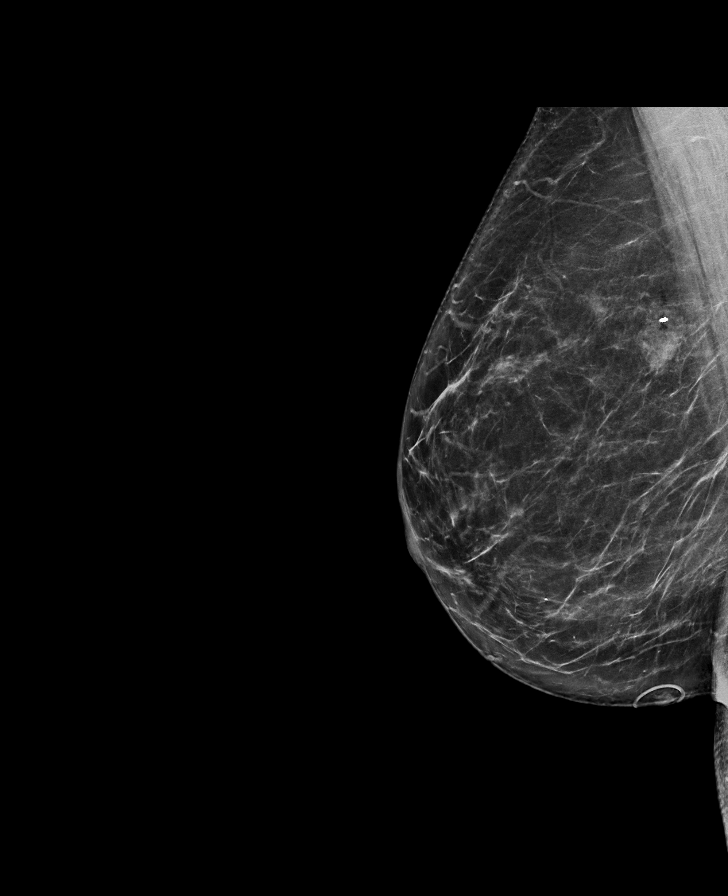

[L CC synth-2D]
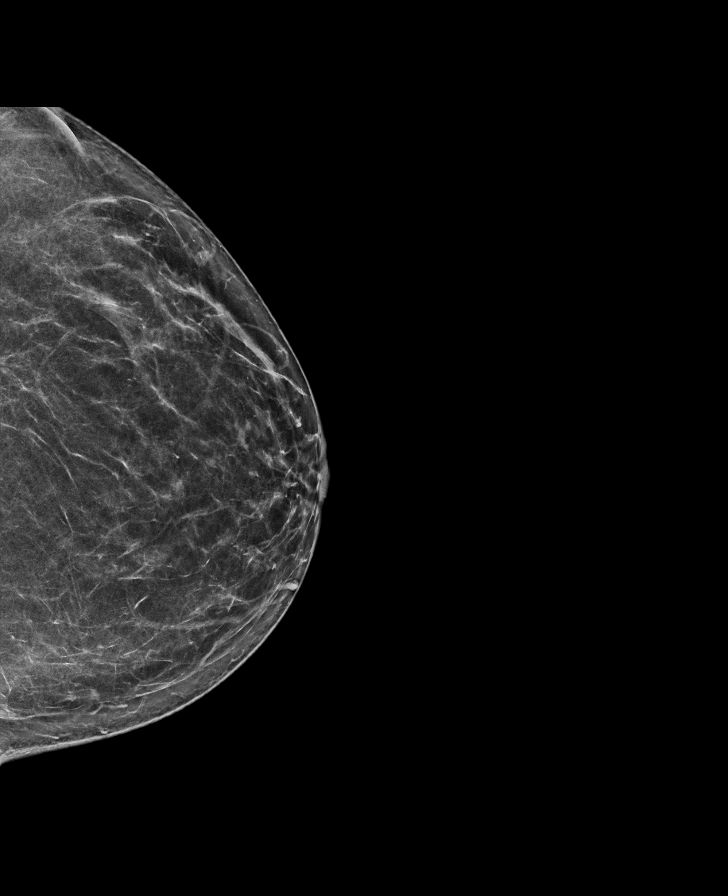

[R CC tomo · tomo slice 39/77.0]
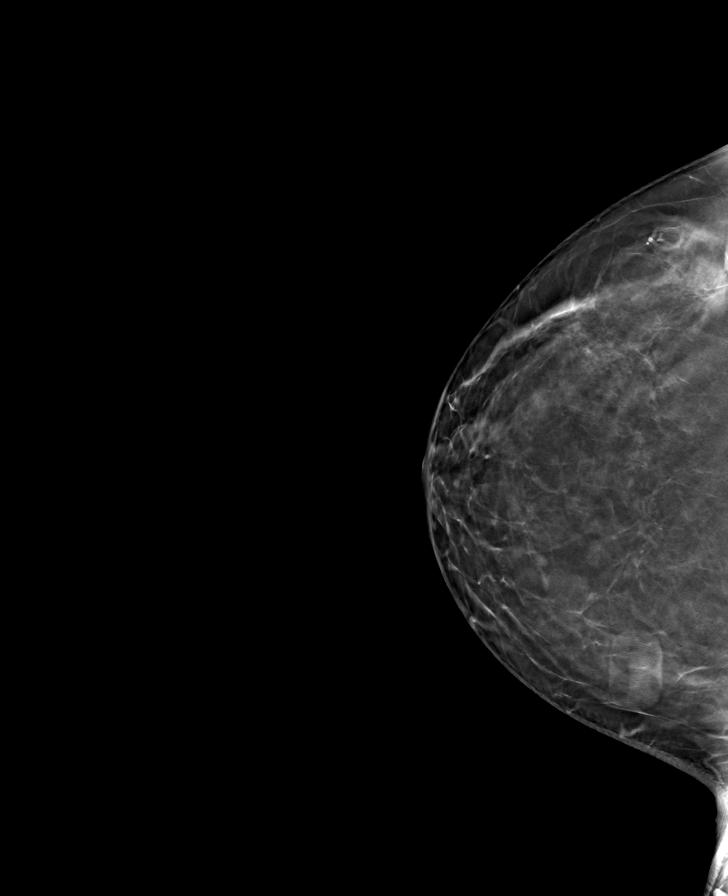

[R MLO tomo · tomo slice 41/81.0]
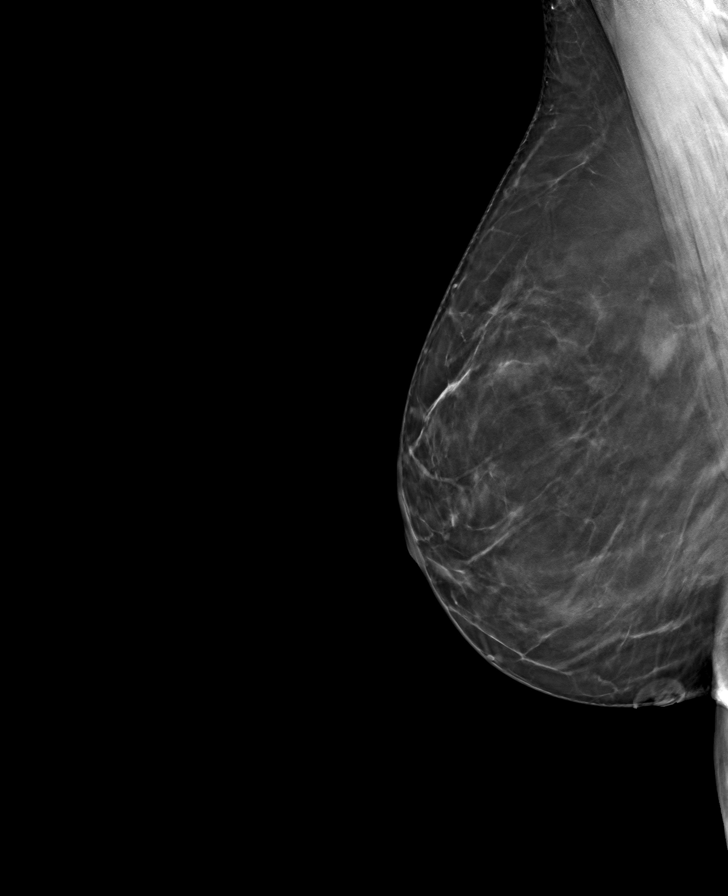

[L MLO tomo · tomo slice 38/75.0]
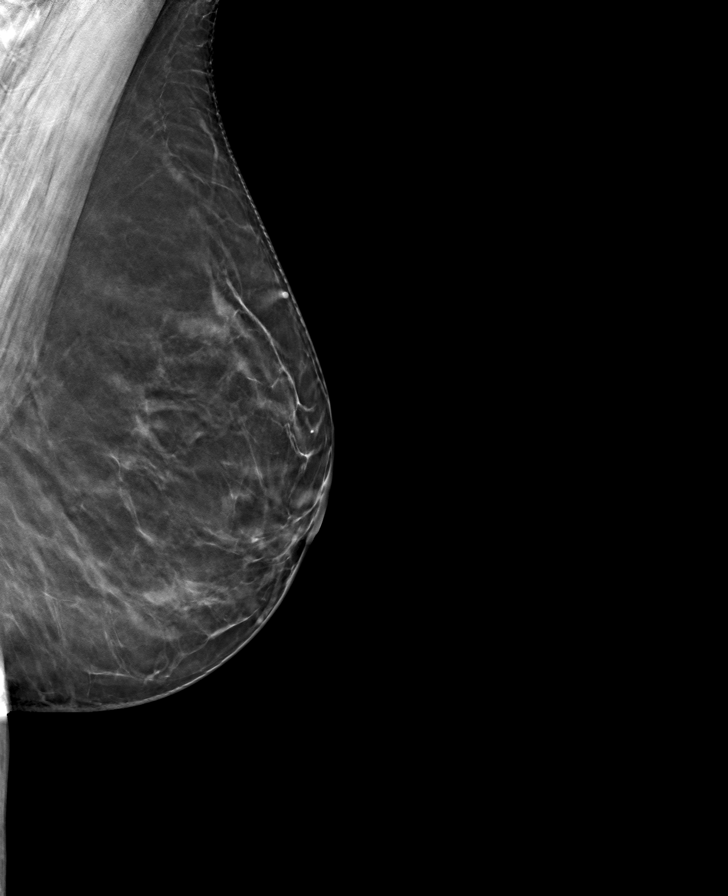

[L CC tomo · tomo slice 39/76.0]
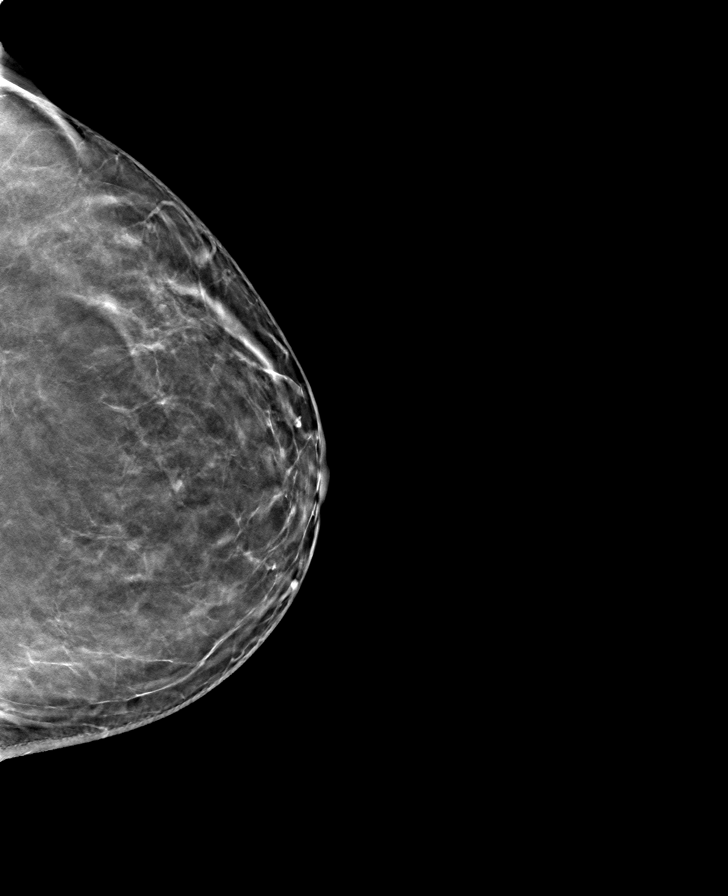

[8 of 24 positions shown; findings below may reference images not displayed]

ACR Breast Density Category b: There are scattered areas of
fibroglandular density.
FINDINGS: There are no findings suspicious for malignancy.
IMPRESSION: No mammographic evidence of malignancy. A result letter of this
screening mammogram will be mailed directly to the patient.

RECOMMENDATION:
Screening mammogram in one year. (Code:51-O-LD2)

BI-RADS CATEGORY  1: Negative.

## 2023-02-07 ENCOUNTER — Ambulatory Visit
Admission: RE | Admit: 2023-02-07 | Discharge: 2023-02-07 | Disposition: A | Discharge status: Home / Self Care | Payer: Medicaid Other | Source: Ambulatory Visit | Attending: Internal Medicine | Admitting: Internal Medicine

## 2023-02-07 DIAGNOSIS — Z1231 Encounter for screening mammogram for malignant neoplasm of breast: Secondary | ICD-10-CM | POA: Diagnosis not present

## 2023-02-22 ENCOUNTER — Ambulatory Visit: Payer: Medicaid Other | Admitting: Internal Medicine

## 2023-02-22 ENCOUNTER — Encounter: Payer: Self-pay | Admitting: Internal Medicine

## 2023-02-22 ENCOUNTER — Ambulatory Visit
Admission: RE | Admit: 2023-02-22 | Discharge: 2023-02-22 | Disposition: A | Discharge status: Home / Self Care | Payer: Medicaid Other | Source: Ambulatory Visit | Attending: Internal Medicine | Admitting: Internal Medicine

## 2023-02-22 ENCOUNTER — Ambulatory Visit
Admission: RE | Admit: 2023-02-22 | Discharge: 2023-02-22 | Disposition: A | Discharge status: Home / Self Care | Payer: Medicaid Other | Attending: Internal Medicine | Admitting: Internal Medicine

## 2023-02-22 VITALS — BP 118/64 | HR 72 | Temp 96.8°F | Wt 146.0 lb

## 2023-02-22 DIAGNOSIS — G8929 Other chronic pain: Secondary | ICD-10-CM | POA: Diagnosis not present

## 2023-02-22 DIAGNOSIS — M542 Cervicalgia: Secondary | ICD-10-CM

## 2023-02-22 DIAGNOSIS — M25512 Pain in left shoulder: Secondary | ICD-10-CM | POA: Diagnosis not present

## 2023-02-22 MED ORDER — PREDNISONE 10 MG PO TABS: ORAL_TABLET | ORAL | 0 refills | Status: DC

## 2023-02-22 NOTE — Progress Notes (Signed)
Subjective:    Patient ID: Denise Estrada, female    DOB: [birth year], 52 y.o.   MRN: [id number]  HPI  Patient presents to clinic today with complaint of left shoulder pain.  This started about 5-6 months ago. She describes the pain as sore and achy. The pain radiates into the left side of her neck. She reports it has been causing headaches. She does have some tingling in her fingers if she lays on her left side but she denies numbness or weakness. She reports this was a workmans comp injury. She was seen 2 months ago, she was treated with Ibuprofen and Methocarbamol which she reports provides some relief. She has also tried [employer] and a heating pad also with some relief of symptoms.   Review of Systems     Past Medical History:  Diagnosis Date   Depression    GERD (gastroesophageal reflux disease)    Hyperlipidemia    Ulcer of esophagus     Current Outpatient Medications  Medication Sig Dispense Refill   busPIRone (BUSPAR) 5 MG tablet Take 1 tablet (5 mg total) by mouth 2 (two) times daily. 180 tablet 1   metaxalone (SKELAXIN) 800 MG tablet Take 1 tablet (800 mg total) by mouth 2 (two) times daily as needed for muscle spasms. 180 tablet 1   No current facility-administered medications for this visit.    Allergies  Allergen Reactions   Erythromycin Base Anaphylaxis   Lansoprazole Nausea Only   Sulfamethoxazole-Trimethoprim Nausea And Vomiting   Tramadol Nausea And Vomiting    Family History  Problem Relation Age of Onset   Heart disease Mother    Stroke Mother    Diabetes Mother    Depression Mother    Dementia Mother    Heart disease Father    Stroke Father    Diabetes Father    Lung cancer Father    Heart attack Brother    Diabetes Brother    Hypertension Brother    Hypertension Maternal Grandmother    Breast cancer Maternal Grandmother        unsure late 54's?   Stroke Maternal Grandfather    Heart attack Maternal Grandfather    Diabetes Maternal  Grandfather    Diabetes Paternal Grandmother    Diabetes Son        type 1    Social History   Socioeconomic History   Marital status: Divorced    Spouse name: Not on file   Number of children: Not on file   Years of education: Not on file   Highest education level: Not on file  Occupational History   Not on file  Tobacco Use   Smoking status: Every Day    Packs/day: 1.00    Years: 20.00    Additional pack years: 0.00    Total pack years: 20.00    Types: Cigarettes   Smokeless tobacco: Never   Tobacco comments:    3/4 pack for most of 20 years  Vaping Use   Vaping Use: Every day   Substances: Nicotine, Flavoring  Substance and Sexual Activity   Alcohol use: No   Drug use: No   Sexual activity: Yes    Birth control/protection: None  Other Topics Concern   Not on file  Social History Narrative   Not on file   Social Determinants of Health   Financial Resource Strain: Not on file  Food Insecurity: Not on file  Transportation Needs: Not on file  Physical Activity:  Not on file  Stress: Not on file  Social Connections: Not on file  Intimate Partner Violence: Not on file     Constitutional: Patient reports intermittent headache.  Denies fever, malaise, fatigue,  or abrupt weight changes.  Respiratory: Denies difficulty breathing, shortness of breath, cough or sputum production.   Cardiovascular: Denies chest pain, chest tightness, palpitations or swelling in the hands or feet.  Musculoskeletal: Patient reports left shoulder pain, left-sided neck pain.  Denies decrease in range of motion, difficulty with gait, or joint swelling.  Skin: Denies redness, rashes, lesions or ulcercations.  Neurological: Patient reports tingling in her left hand.  Denies numbness, weakness or problems with balance and coordination.    No other specific complaints in a complete review of systems (except as listed in HPI above).  Objective:   Physical Exam   BP 118/64 (BP Location:  Left Arm, Patient Position: Sitting, Cuff Size: Normal)   Pulse 72   Temp (!) 96.8 F (36 C) (Temporal)   Wt 146 lb (66.2 kg)   LMP 03/16/2017 (Approximate)   SpO2 100%   BMI 24.30 kg/m   Wt Readings from Last 3 Encounters:  12/05/22 144 lb (65.3 kg)  08/01/22 143 lb (64.9 kg)  09/22/21 147 lb 3.2 oz (66.8 kg)    General: Appears her stated age, well developed, well nourished in NAD. Skin: Warm, dry and intact.  HEENT: Head: normal shape and size; Eyes: sclera white, no icterus, conjunctiva pink, PERRLA and EOMs intact;  Cardiovascular: Normal rate and rhythm. S1,S2 noted.  No murmur, rubs or gallops noted.  Pulmonary/Chest: Normal effort and positive vesicular breath sounds. No respiratory distress. No wheezes, rales or ronchi noted.  Musculoskeletal: Pain with extension of the cervical spine.  Normal flexion, rotation and lateral bending.  No bony tenderness noted over the cervical spine.  Pain with palpation of the left paracervical muscles.  Normal internal and external rotation of the left shoulder.  Negative drop can test on left.  Pain with palpation over the left AC joint, left subacromial bursa.  Strength 5/5 BUE.  Handgrips equal.  Shoulder shrug equal.  No difficulty with gait.  Neurological: Alert and oriented. Coordination normal.  Psychiatric: Mood and affect normal. Behavior is normal. Judgment and thought content normal.    BMET    Component Value Date/Time   NA 140 12/05/2022 0935   K 4.9 12/05/2022 0935   CL 105 12/05/2022 0935   CO2 28 12/05/2022 0935   GLUCOSE 92 12/05/2022 0935   BUN 9 12/05/2022 0935   CREATININE 0.81 12/05/2022 0935   CALCIUM 9.5 12/05/2022 0935   GFRNONAA 104 04/01/2020 0931   GFRAA 120 04/01/2020 0931    Lipid Panel     Component Value Date/Time   CHOL 176 12/05/2022 0935   TRIG 112 12/05/2022 0935   HDL 51 12/05/2022 0935   CHOLHDL 3.5 12/05/2022 0935   VLDL 19 07/17/2016 0800   LDLCALC 104 (H) 12/05/2022 0935    CBC     Component Value Date/Time   WBC 5.3 12/05/2022 0935   RBC 4.81 12/05/2022 0935   HGB 14.7 12/05/2022 0935   HCT 44.4 12/05/2022 0935   PLT 203 12/05/2022 0935   MCV 92.3 12/05/2022 0935   MCH 30.6 12/05/2022 0935   MCHC 33.1 12/05/2022 0935   RDW 13.0 12/05/2022 0935   LYMPHSABS 2,208 04/01/2020 0931   EOSABS 83 04/01/2020 0931   BASOSABS 33 04/01/2020 0931    Hgb A1C Lab Results  Component Value Date   HGBA1C 6.0 (H) 12/05/2022           Assessment & Plan:   Chronic Left Shoulder Pain, Left Side Neck Pain:  Rx for Pred taper x 6 days for symptom management X-ray left shoulder today  Continue Methocarbamol as previously prescribed  Consider referral to orthopedics pending x-ray   RTC in 4 months for follow up chronic conditions [name], NP

## 2023-02-22 NOTE — Patient Instructions (Signed)
Neck Exercises Ask your health care provider which exercises are safe for you. Do exercises exactly as told by your health care provider and adjust them as directed. It is normal to feel mild stretching, pulling, tightness, or discomfort as you do these exercises. Stop right away if you feel sudden pain or your pain gets worse. Do not begin these exercises until told by your health care provider. Neck exercises can be important for many reasons. They can improve strength and maintain flexibility in your neck, which will help your upper back and prevent neck pain. Stretching exercises Rotation neck stretching  Sit in a chair or stand up. Place your feet flat on the floor, shoulder-width apart. Slowly turn your head (rotate) to the right until a slight stretch is felt. Turn it all the way to the right so you can look over your right shoulder. Do not tilt or tip your head. Hold this position for 10-30 seconds. Slowly turn your head (rotate) to the left until a slight stretch is felt. Turn it all the way to the left so you can look over your left shoulder. Do not tilt or tip your head. Hold this position for 10-30 seconds. Repeat __________ times. Complete this exercise __________ times a day. Neck retraction  Sit in a sturdy chair or stand up. Look straight ahead. Do not bend your neck. Use your fingers to push your chin backward (retraction). Do not bend your neck for this movement. Continue to face straight ahead. If you are doing the exercise properly, you will feel a slight sensation in your throat and a stretch at the back of your neck. Hold the stretch for 1-2 seconds. Repeat __________ times. Complete this exercise __________ times a day. Strengthening exercises Neck press  Lie on your back on a firm bed or on the floor with a pillow under your head. Use your neck muscles to push your head down on the pillow and straighten your spine. Hold the position as well as you can. Keep your head  facing up (in a neutral position) and your chin tucked. Slowly count to 5 while holding this position. Repeat __________ times. Complete this exercise __________ times a day. Isometrics These are exercises in which you strengthen the muscles in your neck while keeping your neck still (isometrics). Sit in a supportive chair and place your hand on your forehead. Keep your head and face facing straight ahead. Do not flex or extend your neck while doing isometrics. Push forward with your head and neck while pushing back with your hand. Hold for 10 seconds. Do the sequence again, this time putting your hand against the back of your head. Use your head and neck to push backward against the hand pressure. Finally, do the same exercise on either side of your head, pushing sideways against the pressure of your hand. Repeat __________ times. Complete this exercise __________ times a day. Prone head lifts  Lie face-down (prone position), resting on your elbows so that your chest and upper back are raised. Start with your head facing downward, near your chest. Position your chin either on or near your chest. Slowly lift your head upward. Lift until you are looking straight ahead. Then continue lifting your head as far back as you can comfortably stretch. Hold your head up for 5 seconds. Then slowly lower it to your starting position. Repeat __________ times. Complete this exercise __________ times a day. Supine head lifts  Lie on your back (supine position), bending your knees   to point to the ceiling and keeping your feet flat on the floor. Lift your head slowly off the floor, raising your chin toward your chest. Hold for 5 seconds. Repeat __________ times. Complete this exercise __________ times a day. Scapular retraction  Stand with your arms at your sides. Look straight ahead. Slowly pull both shoulders (scapulae) backward and downward (retraction) until you feel a stretch between your shoulder  blades in your upper back. Hold for 10-30 seconds. Relax and repeat. Repeat __________ times. Complete this exercise __________ times a day. Contact a health care provider if: Your neck pain or discomfort gets worse when you do an exercise. Your neck pain or discomfort does not improve within 2 hours after you exercise. If you have any of these problems, stop exercising right away. Do not do the exercises again unless your health care provider says that you can. Get help right away if: You develop sudden, severe neck pain. If this happens, stop exercising right away. Do not do the exercises again unless your health care provider says that you can. This information is not intended to replace advice given to you by your health care provider. Make sure you discuss any questions you have with your health care provider. Document Revised: 03/08/2021 Document Reviewed: 03/08/2021 [employer] Patient Education  2024 [employer]  

## 2023-02-23 ENCOUNTER — Ambulatory Visit: Payer: Medicaid Other | Admitting: Internal Medicine

## 2023-05-17 DIAGNOSIS — H5213 Myopia, bilateral: Secondary | ICD-10-CM | POA: Diagnosis not present

## 2023-05-18 ENCOUNTER — Encounter: Payer: Self-pay | Admitting: Internal Medicine

## 2023-06-07 ENCOUNTER — Ambulatory Visit: Payer: Medicaid Other | Admitting: Internal Medicine

## 2023-06-07 NOTE — Progress Notes (Deleted)
Subjective:    Patient ID: Denise Estrada, female    DOB: Feb 20, 1971, 52 y.o.   MRN: [id number]  HPI    Review of Systems     Past Medical History:  Diagnosis Date   Depression    GERD (gastroesophageal reflux disease)    Hyperlipidemia    Ulcer of esophagus     Current Outpatient Medications  Medication Sig Dispense Refill   busPIRone (BUSPAR) 5 MG tablet Take 1 tablet (5 mg total) by mouth 2 (two) times daily. 180 tablet 1   metaxalone (SKELAXIN) 800 MG tablet Take 1 tablet (800 mg total) by mouth 2 (two) times daily as needed for muscle spasms. 180 tablet 1   predniSONE (DELTASONE) 10 MG tablet Take 6 tabs on day 1, 5 tabs on day 2, 4 tabs on day 3, 3 tabs on day 4, 2 tabs on day 5, 1 tab on day 6 21 tablet 0   No current facility-administered medications for this visit.    Allergies  Allergen Reactions   Erythromycin Base Anaphylaxis   Lansoprazole Nausea Only   Sulfamethoxazole-Trimethoprim Nausea And Vomiting   Tramadol Nausea And Vomiting    Family History  Problem Relation Age of Onset   Heart disease Mother    Stroke Mother    Diabetes Mother    Depression Mother    Dementia Mother    Heart disease Father    Stroke Father    Diabetes Father    Lung cancer Father    Heart attack Brother    Diabetes Brother    Hypertension Brother    Hypertension Maternal Grandmother    Breast cancer Maternal Grandmother        unsure late 27's?   Stroke Maternal Grandfather    Heart attack Maternal Grandfather    Diabetes Maternal Grandfather    Diabetes Paternal Grandmother    Diabetes Son        type 1    Social History   Socioeconomic History   Marital status: Divorced    Spouse name: Not on file   Number of children: Not on file   Years of education: Not on file   Highest education level: Not on file  Occupational History   Not on file  Tobacco Use   Smoking status: Every Day    Current packs/day: 1.00    Average packs/day: 1 pack/day for 20.0  years (20.0 ttl pk-yrs)    Types: Cigarettes   Smokeless tobacco: Never   Tobacco comments:    3/4 pack for most of 20 years  Vaping Use   Vaping status: Every Day   Substances: Nicotine, Flavoring  Substance and Sexual Activity   Alcohol use: No   Drug use: No   Sexual activity: Yes    Birth control/protection: None  Other Topics Concern   Not on file  Social History Narrative   Not on file   Social Determinants of Health   Financial Resource Strain: Not on file  Food Insecurity: Not on file  Transportation Needs: Not on file  Physical Activity: Not on file  Stress: Not on file  Social Connections: Not on file  Intimate Partner Violence: Not on file     Constitutional: Denies fever, malaise, fatigue, headache or abrupt weight changes.  HEENT: Denies eye pain, eye redness, ear pain, ringing in the ears, wax buildup, runny nose, nasal congestion, bloody nose, or sore throat. Respiratory: Denies difficulty breathing, shortness of breath, cough or sputum production.  Cardiovascular: Denies chest pain, chest tightness, palpitations or swelling in the hands or feet.  Gastrointestinal: Denies abdominal pain, bloating, constipation, diarrhea or blood in the stool.  GU: Denies urgency, frequency, pain with urination, burning sensation, blood in urine, odor or discharge. Musculoskeletal: Patient reports chronic pain.  Denies decrease in range of motion, difficulty with gait, or joint swelling.  Skin: Denies redness, rashes, lesions or ulcercations.  Neurological: Denies dizziness, difficulty with memory, difficulty with speech or problems with balance and coordination.  Psych: Patient has a history of anxiety and depression.  Denies SI/HI.  No other specific complaints in a complete review of systems (except as listed in HPI above).  Objective:   Physical Exam  LMP 03/16/2017 (Approximate)  Wt Readings from Last 3 Encounters:  02/22/23 146 lb (66.2 kg)  12/05/22 144 lb (65.3  kg)  08/01/22 143 lb (64.9 kg)    General: Appears their stated age, well developed, well nourished in NAD. Skin: Warm, dry and intact. No rashes, lesions or ulcerations noted. HEENT: Head: normal shape and size; Eyes: sclera white, no icterus, conjunctiva pink, PERRLA and EOMs intact; Ears: Tm's gray and intact, normal light reflex; Nose: mucosa pink and moist, septum midline; Throat/Mouth: Teeth present, mucosa pink and moist, no exudate, lesions or ulcerations noted.  Neck:  Neck supple, trachea midline. No masses, lumps or thyromegaly present.  Cardiovascular: Normal rate and rhythm. S1,S2 noted.  No murmur, rubs or gallops noted. No JVD or BLE edema. No carotid bruits noted. Pulmonary/Chest: Normal effort and positive vesicular breath sounds. No respiratory distress. No wheezes, rales or ronchi noted.  Abdomen: Soft and nontender. Normal bowel sounds. No distention or masses noted. Liver, spleen and kidneys non palpable. Musculoskeletal: Normal range of motion. No signs of joint swelling. No difficulty with gait.  Neurological: Alert and oriented. Cranial nerves II-XII grossly intact. Coordination normal.  Psychiatric: Mood and affect normal. Behavior is normal. Judgment and thought content normal.   BMET    Component Value Date/Time   NA 140 12/05/2022 0935   K 4.9 12/05/2022 0935   CL 105 12/05/2022 0935   CO2 28 12/05/2022 0935   GLUCOSE 92 12/05/2022 0935   BUN 9 12/05/2022 0935   CREATININE 0.81 12/05/2022 0935   CALCIUM 9.5 12/05/2022 0935   GFRNONAA 104 04/01/2020 0931   GFRAA 120 04/01/2020 0931    Lipid Panel     Component Value Date/Time   CHOL 176 12/05/2022 0935   TRIG 112 12/05/2022 0935   HDL 51 12/05/2022 0935   CHOLHDL 3.5 12/05/2022 0935   VLDL 19 07/17/2016 0800   LDLCALC 104 (H) 12/05/2022 0935    CBC    Component Value Date/Time   WBC 5.3 12/05/2022 0935   RBC 4.81 12/05/2022 0935   HGB 14.7 12/05/2022 0935   HCT 44.4 12/05/2022 0935   PLT 203  12/05/2022 0935   MCV 92.3 12/05/2022 0935   MCH 30.6 12/05/2022 0935   MCHC 33.1 12/05/2022 0935   RDW 13.0 12/05/2022 0935   LYMPHSABS 2,208 04/01/2020 0931   EOSABS 83 04/01/2020 0931   BASOSABS 33 04/01/2020 0931    Hgb A1C Lab Results  Component Value Date   HGBA1C 6.0 (H) 12/05/2022            Assessment & Plan:      RTC in 6 months for your annual exam [name], NP

## 2023-07-01 ENCOUNTER — Telehealth: Payer: Medicaid Other | Admitting: Physician Assistant

## 2023-07-01 DIAGNOSIS — R3989 Other symptoms and signs involving the genitourinary system: Secondary | ICD-10-CM

## 2023-07-01 DIAGNOSIS — J302 Other seasonal allergic rhinitis: Secondary | ICD-10-CM

## 2023-07-01 MED ORDER — NITROFURANTOIN MONOHYD MACRO 100 MG PO CAPS: 100.0000 mg | ORAL_CAPSULE | Freq: Two times a day (BID) | ORAL | 0 refills | Status: DC

## 2023-07-01 MED ORDER — CETIRIZINE HCL 10 MG PO TABS
10.0000 mg | ORAL_TABLET | Freq: Every day | ORAL | 0 refills | Status: DC
Start: 2023-07-01 — End: 2023-09-20

## 2023-07-01 NOTE — Progress Notes (Signed)
Virtual Visit Consent   CATERRA OSTROFF, you are scheduled for a virtual visit with a [facility] provider today. Just as with appointments in the office, your consent must be obtained to participate. Your consent will be active for this visit and any virtual visit you may have with one of our providers in the next 365 days. If you have a MyChart account, a copy of this consent can be sent to you electronically.  As this is a virtual visit, video technology does not allow for your provider to perform a traditional examination. This may limit your provider's ability to fully assess your condition. If your provider identifies any concerns that need to be evaluated in person or the need to arrange testing (such as labs, EKG, etc.), we will make arrangements to do so. Although advances in technology are sophisticated, we cannot ensure that it will always work on either your end or our end. If the connection with a video visit is poor, the visit may have to be switched to a telephone visit. With either a video or telephone visit, we are not always able to ensure that we have a secure connection.  By engaging in this virtual visit, you consent to the provision of healthcare and authorize for your insurance to be billed (if applicable) for the services provided during this visit. Depending on your insurance coverage, you may receive a charge related to this service.  I need to obtain your verbal consent now. Are you willing to proceed with your visit today? Denise Estrada has provided verbal consent on 07/01/2023 for a virtual visit (video or telephone). [name], PA-C  Date: 07/01/2023 4:20 PM  Virtual Visit via Video Note   I, [name], connected with  Denise Estrada  ([id number], [birth year]) on 07/01/23 at  4:15 PM EDT by a video-enabled telemedicine application and verified that I am speaking with the correct person using two identifiers.  Location: Patient: Virtual Visit Location  Patient: Home Provider: Virtual Visit Location Provider: Home Office   I discussed the limitations of evaluation and management by telemedicine and the availability of in person appointments. The patient expressed understanding and agreed to proceed.    History of Present Illness: Denise Estrada is a 52 y.o. who identifies as a female who was assigned female at birth, and is being seen today for possible UTI.  HPI: Urinary Tract Infection  This is a new problem. The current episode started in the past 7 days (3 days). The problem occurs every urination. The problem has been gradually worsening. The quality of the pain is described as aching and burning. The pain is mild. There has been no fever. There is No history of pyelonephritis. Associated symptoms include frequency, hesitancy and urgency. Pertinent negatives include no chills, discharge, flank pain, hematuria, nausea, possible pregnancy or vomiting. She has tried acetaminophen and increased fluids (AZO) for the symptoms. The treatment provided no relief.    Requesting refill of allergy medication as well.  Problems:  Patient Active Problem List   Diagnosis Date Noted   Polycythemia 08/11/2021   Prediabetes 08/11/2021   Chronic pain syndrome 01/08/2017   Anxiety and depression 09/09/2015   COPD (chronic obstructive pulmonary disease) (HCC) 09/09/2015   GERD (gastroesophageal reflux disease) 09/09/2015   Hyperlipidemia 09/09/2015    Allergies:  Allergies  Allergen Reactions   Erythromycin Base Anaphylaxis   Lansoprazole Nausea Only   Sulfamethoxazole-Trimethoprim Nausea And Vomiting   Tramadol Nausea And Vomiting  Medications:  Current Outpatient Medications:    busPIRone (BUSPAR) 5 MG tablet, Take 1 tablet (5 mg total) by mouth 2 (two) times daily., Disp: 180 tablet, Rfl: 1   metaxalone (SKELAXIN) 800 MG tablet, Take 1 tablet (800 mg total) by mouth 2 (two) times daily as needed for muscle spasms., Disp: 180 tablet, Rfl: 1    predniSONE (DELTASONE) 10 MG tablet, Take 6 tabs on day 1, 5 tabs on day 2, 4 tabs on day 3, 3 tabs on day 4, 2 tabs on day 5, 1 tab on day 6, Disp: 21 tablet, Rfl: 0  Observations/Objective: Patient is well-developed, well-nourished in no acute distress.  Resting comfortably at home.  Head is normocephalic, atraumatic.  No labored breathing.  Speech is clear and coherent with logical content.  Patient is alert and oriented at baseline.    Assessment and Plan: There are no diagnoses linked to this encounter. - Worsening symptoms.  - Will treat empirically with Macrobid - May use AZO for bladder spasms - Continue to push fluids.  - Cetirizine refilled for seasonal allergies - Seek in person evaluation for urine culture if symptoms do not improve or if they worsen.    Follow Up Instructions: I discussed the assessment and treatment plan with the patient. The patient was provided an opportunity to ask questions and all were answered. The patient agreed with the plan and demonstrated an understanding of the instructions.  A copy of instructions were sent to the patient via MyChart unless otherwise noted below.    The patient was advised to call back or seek an in-person evaluation if the symptoms worsen or if the condition fails to improve as anticipated.   [name], PA-C

## 2023-07-01 NOTE — Patient Instructions (Signed)
Denise Estrada, thank you for joining [name], PA-C for today's virtual visit.  While this provider is not your primary care provider (PCP), if your PCP is located in our provider database this encounter information will be shared with them immediately following your visit.   A [name] MyChart account gives you access to today's visit and all your visits, tests, and labs performed at [facility] " click here if you don't have a  MyChart account or go to [email or web address]  Consent: (Patient) Denise Estrada provided verbal consent for this virtual visit at the beginning of the encounter.  Current Medications:  Current Outpatient Medications:    cetirizine (ZYRTEC) 10 MG tablet, Take 1 tablet (10 mg total) by mouth daily., Disp: 90 tablet, Rfl: 0   nitrofurantoin, macrocrystal-monohydrate, (MACROBID) 100 MG capsule, Take 1 capsule (100 mg total) by mouth 2 (two) times daily., Disp: 10 capsule, Rfl: 0   busPIRone (BUSPAR) 5 MG tablet, Take 1 tablet (5 mg total) by mouth 2 (two) times daily., Disp: 180 tablet, Rfl: 1   metaxalone (SKELAXIN) 800 MG tablet, Take 1 tablet (800 mg total) by mouth 2 (two) times daily as needed for muscle spasms., Disp: 180 tablet, Rfl: 1   predniSONE (DELTASONE) 10 MG tablet, Take 6 tabs on day 1, 5 tabs on day 2, 4 tabs on day 3, 3 tabs on day 4, 2 tabs on day 5, 1 tab on day 6, Disp: 21 tablet, Rfl: 0   Medications ordered in this encounter:  Meds ordered this encounter  Medications   nitrofurantoin, macrocrystal-monohydrate, (MACROBID) 100 MG capsule    Sig: Take 1 capsule (100 mg total) by mouth 2 (two) times daily.    Dispense:  10 capsule    Refill:  0    Order Specific Question:   Supervising Provider    Answer:   Merrilee Jansky [[id number]]   cetirizine (ZYRTEC) 10 MG tablet    Sig: Take 1 tablet (10 mg total) by mouth daily.    Dispense:  90 tablet    Refill:  0    Order Specific Question:   Supervising  Provider    Answer:   Merrilee Jansky [id number]     *If you need refills on other medications prior to your next appointment, please contact your pharmacy*  Follow-Up: Call back or seek an in-person evaluation if the symptoms worsen or if the condition fails to improve as anticipated.   Virtual Care [phone]  Other Instructions Urinary Tract Infection, Adult  A urinary tract infection (UTI) is an infection of any part of the urinary tract. The urinary tract includes the kidneys, ureters, bladder, and urethra. These organs make, store, and get rid of urine in the body. An upper UTI affects the ureters and kidneys. A lower UTI affects the bladder and urethra. What are the causes? Most urinary tract infections are caused by bacteria in your genital area around your urethra, where urine leaves your body. These bacteria grow and cause inflammation of your urinary tract. What increases the risk? You are more likely to develop this condition if: You have a urinary catheter that stays in place. You are not able to control when you urinate or have a bowel movement (incontinence). You are female and you: Use a spermicide or diaphragm for birth control. Have low estrogen levels. Are pregnant. You have certain genes that increase your risk. You are sexually active. You take antibiotic medicines. You have a  condition that causes your flow of urine to slow down, such as: An enlarged prostate, if you are female. Blockage in your urethra. A kidney stone. A nerve condition that affects your bladder control (neurogenic bladder). Not getting enough to drink, or not urinating often. You have certain medical conditions, such as: Diabetes. A weak disease-fighting system (immunesystem). Sickle cell disease. Gout. Spinal cord injury. What are the signs or symptoms? Symptoms of this condition include: Needing to urinate right away (urgency). Frequent urination. This may include  small amounts of urine each time you urinate. Pain or burning with urination. Blood in the urine. Urine that smells bad or unusual. Trouble urinating. Cloudy urine. Vaginal discharge, if you are female. Pain in the abdomen or the lower back. You may also have: Vomiting or a decreased appetite. Confusion. Irritability or tiredness. A fever or chills. Diarrhea. The first symptom in older adults may be confusion. In some cases, they may not have any symptoms until the infection has worsened. How is this diagnosed? This condition is diagnosed based on your medical history and a physical exam. You may also have other tests, including: Urine tests. Blood tests. Tests for STIs (sexually transmitted infections). If you have had more than one UTI, a cystoscopy or imaging studies may be done to determine the cause of the infections. How is this treated? Treatment for this condition includes: Antibiotic medicine. Over-the-counter medicines to treat discomfort. Drinking enough water to stay hydrated. If you have frequent infections or have other conditions such as a kidney stone, you may need to see a health care provider who specializes in the urinary tract (urologist). In rare cases, urinary tract infections can cause sepsis. Sepsis is a life-threatening condition that occurs when the body responds to an infection. Sepsis is treated in the hospital with IV antibiotics, fluids, and other medicines. Follow these instructions at home:  Medicines Take over-the-counter and prescription medicines only as told by your health care provider. If you were prescribed an antibiotic medicine, take it as told by your health care provider. Do not stop using the antibiotic even if you start to feel better. General instructions Make sure you: Empty your bladder often and completely. Do not hold urine for long periods of time. Empty your bladder after sex. Wipe from front to back after urinating or having a  bowel movement if you are female. Use each tissue only one time when you wipe. Drink enough fluid to keep your urine pale yellow. Keep all follow-up visits. This is important. Contact a health care provider if: Your symptoms do not get better after 1-2 days. Your symptoms go away and then return. Get help right away if: You have severe pain in your back or your lower abdomen. You have a fever or chills. You have nausea or vomiting. Summary A urinary tract infection (UTI) is an infection of any part of the urinary tract, which includes the kidneys, ureters, bladder, and urethra. Most urinary tract infections are caused by bacteria in your genital area. Treatment for this condition often includes antibiotic medicines. If you were prescribed an antibiotic medicine, take it as told by your health care provider. Do not stop using the antibiotic even if you start to feel better. Keep all follow-up visits. This is important. This information is not intended to replace advice given to you by your health care provider. Make sure you discuss any questions you have with your health care provider. Document Revised: 04/18/2020 Document Reviewed: 04/23/2020 Elsevier Patient Education  2024  [employer]    If you have been instructed to have an in-person evaluation today at a local Urgent Care facility, please use the link below. It will take you to a list of all of our available [facility], including address, phone number and hours of operation. Please do not delay care.  [facility]  If you or a family member do not have a primary care provider, use the link below to schedule a visit and establish care. When you choose a [facility] primary care physician or advanced practice provider, you gain a long-term partner in health. Find a Primary Care Provider  Learn more about [facility] in-office and virtual care options: [facility] - Get Care Now

## 2023-09-07 ENCOUNTER — Telehealth: Payer: Medicaid Other | Admitting: Family Medicine

## 2023-09-07 DIAGNOSIS — R3989 Other symptoms and signs involving the genitourinary system: Secondary | ICD-10-CM

## 2023-09-07 MED ORDER — CEPHALEXIN 500 MG PO CAPS
500.0000 mg | ORAL_CAPSULE | Freq: Two times a day (BID) | ORAL | 0 refills | Status: AC
Start: 2023-09-07 — End: 2023-09-14

## 2023-09-07 NOTE — Progress Notes (Signed)
Virtual Visit Consent   [name], you are scheduled for a virtual visit with a [facility] provider today. Just as with appointments in the office, your consent must be obtained to participate. Your consent will be active for this visit and any virtual visit you may have with one of our providers in the next 365 days. If you have a MyChart account, a copy of this consent can be sent to you electronically.  As this is a virtual visit, video technology does not allow for your provider to perform a traditional examination. This may limit your provider's ability to fully assess your condition. If your provider identifies any concerns that need to be evaluated in person or the need to arrange testing (such as labs, EKG, etc.), we will make arrangements to do so. Although advances in technology are sophisticated, we cannot ensure that it will always work on either your end or our end. If the connection with a video visit is poor, the visit may have to be switched to a telephone visit. With either a video or telephone visit, we are not always able to ensure that we have a secure connection.  By engaging in this virtual visit, you consent to the provision of healthcare and authorize for your insurance to be billed (if applicable) for the services provided during this visit. Depending on your insurance coverage, you may receive a charge related to this service.  I need to obtain your verbal consent now. Are you willing to proceed with your visit today? Denise Estrada has provided verbal consent on 09/07/2023 for a virtual visit (video or telephone). Freddy Finner, NP  Date: 09/07/2023 2:43 PM  Virtual Visit via Video Note   I, Freddy Finner, connected with  Denise Estrada  ([id number], 06-01-1971) on 09/07/23 at  2:45 PM EST by a video-enabled telemedicine application and verified that I am speaking with the correct person using two identifiers.  Location: Patient: Virtual Visit Location Patient:  Home Provider: Virtual Visit Location Provider: Home Office   I discussed the limitations of evaluation and management by telemedicine and the availability of in person appointments. The patient expressed understanding and agreed to proceed.    History of Present Illness: Denise Estrada is a 52 y.o. who identifies as a female who was assigned female at birth, and is being seen today for UTI symptoms.  This is a new problem. The current episode started in the past 7 days. The problem occurs every urination. The problem has been gradually worsening. The quality of the pain is described as aching and burning, post voiding. There has been no fever. There is No history of pyelonephritis. Associated symptoms include frequency, hesitancy and urgency. Pertinent negatives include no chills, discharge, flank pain, hematuria, nausea, possible pregnancy or vomiting. She has tried acetaminophen and cranberry juice and increased fluids. The treatment provided no relief.    Problems:  Patient Active Problem List   Diagnosis Date Noted   Polycythemia 08/11/2021   Prediabetes 08/11/2021   Chronic pain syndrome 01/08/2017   Anxiety and depression 09/09/2015   COPD (chronic obstructive pulmonary disease) (HCC) 09/09/2015   GERD (gastroesophageal reflux disease) 09/09/2015   Hyperlipidemia 09/09/2015    Allergies:  Allergies  Allergen Reactions   Erythromycin Base Anaphylaxis   Lansoprazole Nausea Only   Sulfamethoxazole-Trimethoprim Nausea And Vomiting   Tramadol Nausea And Vomiting   Medications:  Current Outpatient Medications:    busPIRone (BUSPAR) 5 MG tablet, Take 1 tablet (5  mg total) by mouth 2 (two) times daily., Disp: 180 tablet, Rfl: 1   cetirizine (ZYRTEC) 10 MG tablet, Take 1 tablet (10 mg total) by mouth daily., Disp: 90 tablet, Rfl: 0   metaxalone (SKELAXIN) 800 MG tablet, Take 1 tablet (800 mg total) by mouth 2 (two) times daily as needed for muscle spasms., Disp: 180 tablet, Rfl: 1    nitrofurantoin, macrocrystal-monohydrate, (MACROBID) 100 MG capsule, Take 1 capsule (100 mg total) by mouth 2 (two) times daily., Disp: 10 capsule, Rfl: 0   predniSONE (DELTASONE) 10 MG tablet, Take 6 tabs on day 1, 5 tabs on day 2, 4 tabs on day 3, 3 tabs on day 4, 2 tabs on day 5, 1 tab on day 6, Disp: 21 tablet, Rfl: 0  Observations/Objective: Patient is well-developed, well-nourished in no acute distress.  Resting comfortably at home.  Head is normocephalic, atraumatic.  No labored breathing.  Speech is clear and coherent with logical content.  Patient is alert and oriented at baseline.    Assessment and Plan:   1. Suspected UTI (Primary)  - cephALEXin (KEFLEX) 500 MG capsule; Take 1 capsule (500 mg total) by mouth 2 (two) times daily for 7 days.  Dispense: 14 capsule; Refill: 0  -no other red flags for stone or kidney infection -increase fluids -complete medication as discussed -prevention discussed and on AVS -seek in person if not improving.   Reviewed side effects, risks and benefits of medication.    Patient acknowledged agreement and understanding of the plan.   Past Medical, Surgical, Social History, Allergies, and Medications have been Reviewed.   Follow Up Instructions: I discussed the assessment and treatment plan with the patient. The patient was provided an opportunity to ask questions and all were answered. The patient agreed with the plan and demonstrated an understanding of the instructions.  A copy of instructions were sent to the patient via MyChart unless otherwise noted below.    The patient was advised to call back or seek an in-person evaluation if the symptoms worsen or if the condition fails to improve as anticipated.    Freddy Finner, NP

## 2023-09-07 NOTE — Patient Instructions (Addendum)
  [name], thank you for joining Freddy Finner, NP for today's virtual visit.  While this provider is not your primary care provider (PCP), if your PCP is located in our provider database this encounter information will be shared with them immediately following your visit.   A [facility] MyChart account gives you access to today's visit and all your visits, tests, and labs performed at [facility] " click here if you don't have a [facility] MyChart account or go to [email or web address]  Consent: (Patient) Denise Estrada provided verbal consent for this virtual visit at the beginning of the encounter.  Current Medications:  Current Outpatient Medications:    cephALEXin (KEFLEX) 500 MG capsule, Take 1 capsule (500 mg total) by mouth 2 (two) times daily for 7 days., Disp: 14 capsule, Rfl: 0   busPIRone (BUSPAR) 5 MG tablet, Take 1 tablet (5 mg total) by mouth 2 (two) times daily., Disp: 180 tablet, Rfl: 1   cetirizine (ZYRTEC) 10 MG tablet, Take 1 tablet (10 mg total) by mouth daily., Disp: 90 tablet, Rfl: 0   metaxalone (SKELAXIN) 800 MG tablet, Take 1 tablet (800 mg total) by mouth 2 (two) times daily as needed for muscle spasms., Disp: 180 tablet, Rfl: 1   nitrofurantoin, macrocrystal-monohydrate, (MACROBID) 100 MG capsule, Take 1 capsule (100 mg total) by mouth 2 (two) times daily., Disp: 10 capsule, Rfl: 0   predniSONE (DELTASONE) 10 MG tablet, Take 6 tabs on day 1, 5 tabs on day 2, 4 tabs on day 3, 3 tabs on day 4, 2 tabs on day 5, 1 tab on day 6, Disp: 21 tablet, Rfl: 0   Medications ordered in this encounter:  Meds ordered this encounter  Medications   cephALEXin (KEFLEX) 500 MG capsule    Sig: Take 1 capsule (500 mg total) by mouth 2 (two) times daily for 7 days.    Dispense:  14 capsule    Refill:  0    Supervising Provider:   Merrilee Jansky [[id number]]     *If you need refills on other medications prior to your next appointment, please contact your  pharmacy*  Follow-Up: Call back or seek an in-person evaluation if the symptoms worsen or if the condition fails to improve as anticipated.  [facility] [phone]  Other Instructions   -increase fluids -complete medication as discussed -prevention discussed  -follow up if not improving    If you have been instructed to have an in-person evaluation today at a local Urgent Care facility, please use the link below. It will take you to a list of all of our available [facility], including address, phone number and hours of operation. Please do not delay care.  [facility]  If you or a family member do not have a primary care provider, use the link below to schedule a visit and establish care. When you choose a [facility] primary care physician or advanced practice provider, you gain a long-term partner in health. Find a Primary Care Provider  Learn more about [facility]'s in-office and virtual care options: [facility] - Get Care Now

## 2023-09-20 ENCOUNTER — Other Ambulatory Visit: Payer: Self-pay | Admitting: Physician Assistant

## 2023-09-20 DIAGNOSIS — J302 Other seasonal allergic rhinitis: Secondary | ICD-10-CM

## 2023-09-20 MED ORDER — CETIRIZINE HCL 10 MG PO TABS: 10.0000 mg | ORAL_TABLET | Freq: Every day | ORAL | 0 refills | Status: DC

## 2023-11-19 ENCOUNTER — Ambulatory Visit (INDEPENDENT_AMBULATORY_CARE_PROVIDER_SITE_OTHER): Payer: Medicaid Other | Admitting: Internal Medicine

## 2023-11-19 ENCOUNTER — Encounter: Payer: Self-pay | Admitting: Internal Medicine

## 2023-11-19 VITALS — BP 118/62 | Ht 65.0 in | Wt 176.2 lb

## 2023-11-19 DIAGNOSIS — E66811 Obesity, class 1: Secondary | ICD-10-CM | POA: Insufficient documentation

## 2023-11-19 DIAGNOSIS — J432 Centrilobular emphysema: Secondary | ICD-10-CM

## 2023-11-19 DIAGNOSIS — E663 Overweight: Secondary | ICD-10-CM | POA: Diagnosis not present

## 2023-11-19 DIAGNOSIS — K219 Gastro-esophageal reflux disease without esophagitis: Secondary | ICD-10-CM | POA: Diagnosis not present

## 2023-11-19 DIAGNOSIS — G894 Chronic pain syndrome: Secondary | ICD-10-CM

## 2023-11-19 DIAGNOSIS — R7303 Prediabetes: Secondary | ICD-10-CM | POA: Diagnosis not present

## 2023-11-19 DIAGNOSIS — F32A Depression, unspecified: Secondary | ICD-10-CM

## 2023-11-19 DIAGNOSIS — Z6829 Body mass index (BMI) 29.0-29.9, adult: Secondary | ICD-10-CM | POA: Diagnosis not present

## 2023-11-19 DIAGNOSIS — F419 Anxiety disorder, unspecified: Secondary | ICD-10-CM | POA: Diagnosis not present

## 2023-11-19 DIAGNOSIS — D751 Secondary polycythemia: Secondary | ICD-10-CM | POA: Diagnosis not present

## 2023-11-19 DIAGNOSIS — E782 Mixed hyperlipidemia: Secondary | ICD-10-CM

## 2023-11-19 DIAGNOSIS — J302 Other seasonal allergic rhinitis: Secondary | ICD-10-CM | POA: Diagnosis not present

## 2023-11-19 DIAGNOSIS — S61211A Laceration without foreign body of left index finger without damage to nail, initial encounter: Secondary | ICD-10-CM

## 2023-11-19 MED ORDER — METAXALONE 800 MG PO TABS: 800.0000 mg | ORAL_TABLET | Freq: Two times a day (BID) | ORAL | 1 refills | Status: DC | PRN

## 2023-11-19 MED ORDER — CETIRIZINE HCL 10 MG PO TABS
10.0000 mg | ORAL_TABLET | Freq: Every day | ORAL | 1 refills | Status: DC
Start: 2023-11-19 — End: 2024-05-05

## 2023-11-19 NOTE — Progress Notes (Signed)
 Subjective:    Patient ID: Denise Estrada, female    DOB: March 06, 1971, 53 y.o.   MRN: [id number]  HPI  Patient presents to clinic today for follow-up of chronic conditions.  Anxiety and depression: Chronic, she is not currently taking any medications for this but has buspar if she needs it.  She is not currently seeing a therapist but has in the past but did not feel like it was helpful.  She denies SI/HI.  COPD: She denies chronic cough or shortness of breath.  She is not using any inhalers at this time.  She no longer smokes.  There are no PFTs on file.  Chronic Pain: Mainly in her spine.  She takes tylenol and skelaxin as needed with some relief of symptoms.  She does not follow with pain management.  GERD: Triggered by acidic food. She takes tums, as needed with good relief of symptoms.  There is no upper GI on file.  HLD: Her last LDL was 104, triglycerides 112, 11/2022.  She is not taking any cholesterol-lowering medication at this time.  She tries to consume a low-fat diet.  Polycythemia: Her last H/H was 14.7/44.4, 11/2022 she does smoke.  She does not follow with hematology.  Prediabetes: Her last A1c was 6 %, 11/2022.  She is not taking any oral diabetic medications time.  She does not check her sugars.  Pt reports a cut to her left index finger. This occurred 3 weeks ago. She went to the ER but LWBS. She put a butterfly bandage on it and reports it healed nicely. She has decreased flexion of the left index finger which she is concerned about. She has not had a tetanus in the last 10 years.    Review of Systems  Past Medical History:  Diagnosis Date   Depression    GERD (gastroesophageal reflux disease)    Hyperlipidemia    Ulcer of esophagus     Current Outpatient Medications  Medication Sig Dispense Refill   busPIRone (BUSPAR) 5 MG tablet Take 1 tablet (5 mg total) by mouth 2 (two) times daily. 180 tablet 1   cetirizine (ZYRTEC) 10 MG tablet Take 1 tablet (10 mg total)  by mouth daily. 90 tablet 0   metaxalone (SKELAXIN) 800 MG tablet Take 1 tablet (800 mg total) by mouth 2 (two) times daily as needed for muscle spasms. 180 tablet 1   nitrofurantoin, macrocrystal-monohydrate, (MACROBID) 100 MG capsule Take 1 capsule (100 mg total) by mouth 2 (two) times daily. 10 capsule 0   predniSONE (DELTASONE) 10 MG tablet Take 6 tabs on day 1, 5 tabs on day 2, 4 tabs on day 3, 3 tabs on day 4, 2 tabs on day 5, 1 tab on day 6 21 tablet 0   No current facility-administered medications for this visit.    Allergies  Allergen Reactions   Erythromycin Base Anaphylaxis   Lansoprazole Nausea Only   Sulfamethoxazole-Trimethoprim Nausea And Vomiting   Tramadol Nausea And Vomiting    Family History  Problem Relation Age of Onset   Heart disease Mother    Stroke Mother    Diabetes Mother    Depression Mother    Dementia Mother    Heart disease Father    Stroke Father    Diabetes Father    Lung cancer Father    Heart attack Brother    Diabetes Brother    Hypertension Brother    Hypertension Maternal Grandmother    Breast cancer Maternal Grandmother  unsure late 75's?   Stroke Maternal Grandfather    Heart attack Maternal Grandfather    Diabetes Maternal Grandfather    Diabetes Paternal Grandmother    Diabetes Son        type 1    Social History   Socioeconomic History   Marital status: Divorced    Spouse name: Not on file   Number of children: Not on file   Years of education: Not on file   Highest education level: Not on file  Occupational History   Not on file  Tobacco Use   Smoking status: Every Day    Current packs/day: 1.00    Average packs/day: 1 pack/day for 20.0 years (20.0 ttl pk-yrs)    Types: Cigarettes   Smokeless tobacco: Never   Tobacco comments:    3/4 pack for most of 20 years  Vaping Use   Vaping status: Every Day   Substances: Nicotine, Flavoring  Substance and Sexual Activity   Alcohol use: No   Drug use: No   Sexual  activity: Yes    Birth control/protection: None  Other Topics Concern   Not on file  Social History Narrative   Not on file   Social Drivers of Health   Financial Resource Strain: Not on file  Food Insecurity: Not on file  Transportation Needs: Not on file  Physical Activity: Not on file  Stress: Not on file  Social Connections: Not on file  Intimate Partner Violence: Not on file     Constitutional: Denies fever, malaise, fatigue, headache or abrupt weight changes.  HEENT: Denies eye pain, eye redness, ear pain, ringing in the ears, wax buildup, runny nose, nasal congestion, bloody nose, or sore throat. Respiratory: Denies difficulty breathing, shortness of breath, cough or sputum production.   Cardiovascular: Denies chest pain, chest tightness, palpitations or swelling in the hands or feet.  Gastrointestinal: Patient reports intermittent reflux.  Denies abdominal pain, bloating, constipation, diarrhea or blood in the stool.  GU: Denies urgency, frequency, pain with urination, burning sensation, blood in urine, odor or discharge. Musculoskeletal: Patient reports chronic back pain, decreased range of motion of the left index finger..  Denies decrease in range of motion, difficulty with gait, or joint swelling.  Skin: Denies redness, rashes, lesions or ulcercations.  Neurological: Denies dizziness, difficulty with memory, difficulty with speech or problems with balance and coordination.  Psych: Patient has a history of anxiety and depression.  Denies SI/HI.  No other specific complaints in a complete review of systems (except as listed in HPI above).     Objective:   Physical Exam  BP 118/62 (BP Location: Right Arm, Patient Position: Sitting, Cuff Size: Normal)   Ht 5\' 5"  (1.651 m)   Wt 176 lb 3.2 oz (79.9 kg)   LMP 03/16/2017 (Approximate)   BMI 29.32 kg/m    Wt Readings from Last 3 Encounters:  02/22/23 146 lb (66.2 kg)  12/05/22 144 lb (65.3 kg)  08/01/22 143 lb (64.9  kg)    General: Appears her stated age, well developed, well nourished in NAD. Skin: Warm, dry and intact.  HEENT: Head: normal shape and size; Eyes: sclera white, no icterus, conjunctiva pink, PERRLA and EOMs intact;  Cardiovascular: Normal rate and rhythm. S1,S2 noted.  No murmur, rubs or gallops noted.  Pulmonary/Chest: Normal effort and positive vesicular breath sounds. No respiratory distress. No wheezes, rales or ronchi noted.  Abdomen: Soft and nontender. Normal bowel sounds.  Musculoskeletal: Decreased active flexion of the left index  finger but normal active flexion.  No difficulty with gait.  Neurological: Alert and oriented.  Psychiatric: Mood and affect normal.  Mildly anxious appearing. Judgment and thought content normal.    BMET    Component Value Date/Time   NA 140 12/05/2022 0935   K 4.9 12/05/2022 0935   CL 105 12/05/2022 0935   CO2 28 12/05/2022 0935   GLUCOSE 92 12/05/2022 0935   BUN 9 12/05/2022 0935   CREATININE 0.81 12/05/2022 0935   CALCIUM 9.5 12/05/2022 0935   GFRNONAA 104 04/01/2020 0931   GFRAA 120 04/01/2020 0931    Lipid Panel     Component Value Date/Time   CHOL 176 12/05/2022 0935   TRIG 112 12/05/2022 0935   HDL 51 12/05/2022 0935   CHOLHDL 3.5 12/05/2022 0935   VLDL 19 07/17/2016 0800   LDLCALC 104 (H) 12/05/2022 0935    CBC    Component Value Date/Time   WBC 5.3 12/05/2022 0935   RBC 4.81 12/05/2022 0935   HGB 14.7 12/05/2022 0935   HCT 44.4 12/05/2022 0935   PLT 203 12/05/2022 0935   MCV 92.3 12/05/2022 0935   MCH 30.6 12/05/2022 0935   MCHC 33.1 12/05/2022 0935   RDW 13.0 12/05/2022 0935   LYMPHSABS 2,208 04/01/2020 0931   EOSABS 83 04/01/2020 0931   BASOSABS 33 04/01/2020 0931    Hgb A1C Lab Results  Component Value Date   HGBA1C 6.0 (H) 12/05/2022           Assessment & Plan:   Decreased flexion of the left index finger:  Likely due to recent laceration that was repaired at home, possible tendon  injury Recommend range of motion exercises If persist or worsen, would recommend referral to hand surgery She declines tetanus today  RTC in 6 months for your annual exam [name], NP

## 2023-11-19 NOTE — Assessment & Plan Note (Signed)
Encourage low-carb diet A1c today 

## 2023-11-19 NOTE — Patient Instructions (Signed)

## 2023-11-19 NOTE — Assessment & Plan Note (Signed)
 She has BuSpar on hand if needed Support offered

## 2023-11-19 NOTE — Assessment & Plan Note (Signed)
 Encouraged diet and exercise for weight loss ?

## 2023-11-19 NOTE — Assessment & Plan Note (Signed)
 Continue Tylenol Skelaxin refilled today Encourage regular stretching and core strengthening

## 2023-11-19 NOTE — Assessment & Plan Note (Signed)
 C-Met and lipid profile today Encouraged her to consume a low-fat diet

## 2023-11-19 NOTE — Assessment & Plan Note (Signed)
 Avoid foods that trigger reflux Okay to continue Tums as needed

## 2023-11-19 NOTE — Assessment & Plan Note (Signed)
 Congratulated her on smoking cessation Not currently using any inhalers

## 2023-11-19 NOTE — Assessment & Plan Note (Signed)
 CBC today.

## 2023-11-20 ENCOUNTER — Encounter: Payer: Self-pay | Admitting: Internal Medicine

## 2023-11-20 LAB — CBC
HCT: 41.1 % (ref 35.0–45.0)
Hemoglobin: 13.9 g/dL (ref 11.7–15.5)
MCH: 31.3 pg (ref 27.0–33.0)
MCHC: 33.8 g/dL (ref 32.0–36.0)
MCV: 92.6 fL (ref 80.0–100.0)
MPV: 12.5 fL (ref 7.5–12.5)
Platelets: 191 10*3/uL (ref 140–400)
RBC: 4.44 10*6/uL (ref 3.80–5.10)
RDW: 12.4 % (ref 11.0–15.0)
WBC: 4.2 10*3/uL (ref 3.8–10.8)

## 2023-11-20 LAB — COMPLETE METABOLIC PANEL WITH GFR
AG Ratio: 1.8 (calc) (ref 1.0–2.5)
ALT: 63 U/L — ABNORMAL HIGH (ref 6–29)
AST: 29 U/L (ref 10–35)
Albumin: 4.4 g/dL (ref 3.6–5.1)
Alkaline phosphatase (APISO): 85 U/L (ref 37–153)
BUN: 13 mg/dL (ref 7–25)
CO2: 27 mmol/L (ref 20–32)
Calcium: 9.2 mg/dL (ref 8.6–10.4)
Chloride: 105 mmol/L (ref 98–110)
Creat: 0.8 mg/dL (ref 0.50–1.03)
Globulin: 2.4 g/dL (ref 1.9–3.7)
Glucose, Bld: 101 mg/dL — ABNORMAL HIGH (ref 65–99)
Potassium: 4.4 mmol/L (ref 3.5–5.3)
Sodium: 139 mmol/L (ref 135–146)
Total Bilirubin: 0.5 mg/dL (ref 0.2–1.2)
Total Protein: 6.8 g/dL (ref 6.1–8.1)
eGFR: 88 mL/min/{1.73_m2} (ref >=60)

## 2023-11-20 LAB — HEMOGLOBIN A1C
Hgb A1c MFr Bld: 6.2 %{Hb} — ABNORMAL HIGH (ref <5.7)
Mean Plasma Glucose: 131 mg/dL
eAG (mmol/L): 7.3 mmol/L

## 2023-11-20 LAB — LIPID PANEL
Cholesterol: 169 mg/dL (ref <200)
HDL: 40 mg/dL — ABNORMAL LOW (ref >=50)
LDL Cholesterol (Calc): 106 mg/dL — ABNORMAL HIGH
Non-HDL Cholesterol (Calc): 129 mg/dL (ref <130)
Total CHOL/HDL Ratio: 4.2 (calc) (ref <5.0)
Triglycerides: 130 mg/dL (ref <150)

## 2024-05-05 ENCOUNTER — Ambulatory Visit (INDEPENDENT_AMBULATORY_CARE_PROVIDER_SITE_OTHER): Admitting: Internal Medicine

## 2024-05-05 ENCOUNTER — Encounter: Payer: Self-pay | Admitting: Internal Medicine

## 2024-05-05 ENCOUNTER — Other Ambulatory Visit (HOSPITAL_COMMUNITY)
Admission: RE | Admit: 2024-05-05 | Discharge: 2024-05-05 | Disposition: A | Discharge status: Home / Self Care | Source: Ambulatory Visit | Attending: Internal Medicine | Admitting: Internal Medicine

## 2024-05-05 VITALS — BP 118/74 | Ht 65.0 in | Wt 178.8 lb

## 2024-05-05 DIAGNOSIS — K219 Gastro-esophageal reflux disease without esophagitis: Secondary | ICD-10-CM | POA: Diagnosis not present

## 2024-05-05 DIAGNOSIS — Z1231 Encounter for screening mammogram for malignant neoplasm of breast: Secondary | ICD-10-CM | POA: Diagnosis not present

## 2024-05-05 DIAGNOSIS — E663 Overweight: Secondary | ICD-10-CM | POA: Diagnosis not present

## 2024-05-05 DIAGNOSIS — Z124 Encounter for screening for malignant neoplasm of cervix: Secondary | ICD-10-CM

## 2024-05-05 DIAGNOSIS — J432 Centrilobular emphysema: Secondary | ICD-10-CM

## 2024-05-05 DIAGNOSIS — E782 Mixed hyperlipidemia: Secondary | ICD-10-CM

## 2024-05-05 DIAGNOSIS — R7303 Prediabetes: Secondary | ICD-10-CM

## 2024-05-05 DIAGNOSIS — Z0001 Encounter for general adult medical examination with abnormal findings: Secondary | ICD-10-CM

## 2024-05-05 DIAGNOSIS — Z6829 Body mass index (BMI) 29.0-29.9, adult: Secondary | ICD-10-CM

## 2024-05-05 LAB — CBC
HCT: 44.2 % (ref 35.0–45.0)
Hemoglobin: 14.5 g/dL (ref 11.7–15.5)
MCH: 31.4 pg (ref 27.0–33.0)
MCHC: 32.8 g/dL (ref 32.0–36.0)
MCV: 95.7 fL (ref 80.0–100.0)
MPV: 11.6 fL (ref 7.5–12.5)
Platelets: 195 Thousand/uL (ref 140–400)
RBC: 4.62 Million/uL (ref 3.80–5.10)
RDW: 13.2 % (ref 11.0–15.0)
WBC: 4.7 Thousand/uL (ref 3.8–10.8)

## 2024-05-05 LAB — LIPID PANEL
Cholesterol: 202 mg/dL — ABNORMAL HIGH (ref <200)
HDL: 40 mg/dL — ABNORMAL LOW (ref >=50)
LDL Cholesterol (Calc): 133 mg/dL — ABNORMAL HIGH
Non-HDL Cholesterol (Calc): 162 mg/dL — ABNORMAL HIGH (ref <130)
Total CHOL/HDL Ratio: 5.1 (calc) — ABNORMAL HIGH (ref <5.0)
Triglycerides: 157 mg/dL — ABNORMAL HIGH (ref <150)

## 2024-05-05 LAB — HEMOGLOBIN A1C
Hgb A1c MFr Bld: 6.3 % — ABNORMAL HIGH (ref <5.7)
Mean Plasma Glucose: 134 mg/dL
eAG (mmol/L): 7.4 mmol/L

## 2024-05-05 LAB — COMPREHENSIVE METABOLIC PANEL WITH GFR
AG Ratio: 2.1 (calc) (ref 1.0–2.5)
ALT: 62 U/L — ABNORMAL HIGH (ref 6–29)
AST: 29 U/L (ref 10–35)
Albumin: 4.8 g/dL (ref 3.6–5.1)
Alkaline phosphatase (APISO): 91 U/L (ref 37–153)
BUN: 18 mg/dL (ref 7–25)
CO2: 27 mmol/L (ref 20–32)
Calcium: 9.5 mg/dL (ref 8.6–10.4)
Chloride: 103 mmol/L (ref 98–110)
Creat: 0.88 mg/dL (ref 0.50–1.03)
Globulin: 2.3 g/dL (ref 1.9–3.7)
Glucose, Bld: 105 mg/dL (ref 65–139)
Potassium: 4.9 mmol/L (ref 3.5–5.3)
Sodium: 138 mmol/L (ref 135–146)
Total Bilirubin: 0.5 mg/dL (ref 0.2–1.2)
Total Protein: 7.1 g/dL (ref 6.1–8.1)
eGFR: 78 mL/min/1.73m2 (ref >=60)

## 2024-05-05 MED ORDER — CETIRIZINE HCL 10 MG PO TABS
10.0000 mg | ORAL_TABLET | Freq: Every day | ORAL | 1 refills | Status: AC
Start: 2024-05-05 — End: ?

## 2024-05-05 MED ORDER — ALBUTEROL SULFATE HFA 108 (90 BASE) MCG/ACT IN AERS: 2.0000 | INHALATION_SPRAY | Freq: Four times a day (QID) | RESPIRATORY_TRACT | 0 refills | Status: DC | PRN

## 2024-05-05 MED ORDER — PANTOPRAZOLE SODIUM 20 MG PO TBEC: 20.0000 mg | DELAYED_RELEASE_TABLET | Freq: Every day | ORAL | 1 refills | Status: DC

## 2024-05-05 NOTE — Assessment & Plan Note (Signed)
 Encouraged smoking cessation including vapes She declines starting daily maintenance inhaler at this time Rx for albuterol  1 to 2 puffs every 4-6 hours as needed

## 2024-05-05 NOTE — Assessment & Plan Note (Signed)
 Encouraged diet and exercise for weight loss ?

## 2024-05-05 NOTE — Assessment & Plan Note (Signed)
 Deteriorated Avoid foods that trigger reflux Encourage weight loss as this can help reduce reflux symptoms Rx for pantoprazole  20 mg daily

## 2024-05-05 NOTE — Progress Notes (Signed)
 Subjective:    Patient ID: Denise Estrada, female    DOB: 1971/03/13, 53 y.o.   MRN: [id number]  HPI  Patient presents to clinic today for her annual exam.  She also reports intermittent shortness of breath.  She has a history of COPD.  She reports she vapes but does not smoke.  She is not using any inhalers at this time but has been on albuterol  in the past.  She would like this refilled today.  She also reports frequent heartburn.  She reports this is triggered by spicy foods.  She was on pantoprazole  in the past and would like to get restarted on this.  There is no upper GI on file.  Flu: never Tetanus:< 10 years ago COVID: never Shingrix: never Prevnar: Never Pap smear: 08/2018 Mammogram: 01/2023 Colon screening: 04/2020, Cologuard CT lung cancer screening: Never Vision screening: annually Dentist: as needed, dentures  Diet: She does eat meat. She consumes some fruits, more veggies. She tries to avoid fried foods. She drinks mostly water, Sprite, espresso. Exercise: Walking  Review of Systems  Past Medical History:  Diagnosis Date   Depression    GERD (gastroesophageal reflux disease)    Hyperlipidemia    Ulcer of esophagus     Current Outpatient Medications  Medication Sig Dispense Refill   busPIRone  (BUSPAR ) 5 MG tablet Take 1 tablet (5 mg total) by mouth 2 (two) times daily. (Patient not taking: Reported on 11/19/2023) 180 tablet 1   cetirizine  (ZYRTEC ) 10 MG tablet Take 1 tablet (10 mg total) by mouth daily. 90 tablet 1   metaxalone  (SKELAXIN ) 800 MG tablet Take 1 tablet (800 mg total) by mouth 2 (two) times daily as needed for muscle spasms. 180 tablet 1   No current facility-administered medications for this visit.    Allergies  Allergen Reactions   Erythromycin Base Anaphylaxis   Lansoprazole Nausea Only   Sulfamethoxazole-Trimethoprim Nausea And Vomiting   Tramadol Nausea And Vomiting    Family History  Problem Relation Age of Onset   Heart disease  Mother    Stroke Mother    Diabetes Mother    Depression Mother    Dementia Mother    Heart disease Father    Stroke Father    Diabetes Father    Lung cancer Father    Heart attack Brother    Diabetes Brother    Hypertension Brother    Hypertension Maternal Grandmother    Breast cancer Maternal Grandmother        unsure late 40's?   Stroke Maternal Grandfather    Heart attack Maternal Grandfather    Diabetes Maternal Grandfather    Diabetes Paternal Grandmother    Diabetes Son        type 1    Social History   Socioeconomic History   Marital status: Divorced    Spouse name: Not on file   Number of children: Not on file   Years of education: Not on file   Highest education level: Not on file  Occupational History   Not on file  Tobacco Use   Smoking status: Former    Types: Cigarettes    Start date: 12/16/2022    Quit date: 1983    Years since quitting: 42.6   Smokeless tobacco: Never   Tobacco comments:    3/4 pack for most of 20 years  Vaping Use   Vaping status: Former   Substances: Nicotine, Flavoring  Substance and Sexual Activity   Alcohol use:  No   Drug use: No   Sexual activity: Yes    Birth control/protection: None  Other Topics Concern   Not on file  Social History Narrative   Not on file   Social Drivers of Health   Financial Resource Strain: Not on file  Food Insecurity: Not on file  Transportation Needs: Not on file  Physical Activity: Not on file  Stress: Not on file  Social Connections: Not on file  Intimate Partner Violence: Not on file     Constitutional: Denies fever, malaise, fatigue, headache or abrupt weight changes.  HEENT: Denies eye pain, eye redness, ear pain, ringing in the ears, wax buildup, runny nose, nasal congestion, bloody nose, or sore throat. Respiratory: Patient reports intermittent shortness of breath.  Denies difficulty breathing, cough or sputum production.   Cardiovascular: Denies chest pain, chest tightness,  palpitations or swelling in the hands or feet.  Gastrointestinal: Patient reports intermittent reflux.  Denies abdominal pain, bloating, constipation, diarrhea or blood in the stool.  GU: Denies urgency, frequency, pain with urination, burning sensation, blood in urine, odor or discharge. Musculoskeletal: Patient reports intermittent joint pain. Denies decrease in range of motion, difficulty with gait, muscle pain or joint swelling.  Skin: Denies redness, rashes, lesions or ulcercations.  Neurological: Denies dizziness, difficulty with memory, difficulty with speech or problems with balance and coordination.  Psych: Patient has a history of anxiety and depression.  Denies SI/HI.  No other specific complaints in a complete review of systems (except as listed in HPI above).     Objective:   Physical Exam  BP 118/74 (BP Location: Left Arm, Patient Position: Sitting, Cuff Size: Normal)   Ht 5' 5 (1.651 m)   Wt 178 lb 12.8 oz (81.1 kg)   LMP 03/16/2017 (Approximate)   BMI 29.75 kg/m    Wt Readings from Last 3 Encounters:  11/19/23 176 lb 3.2 oz (79.9 kg)  02/22/23 146 lb (66.2 kg)  12/05/22 144 lb (65.3 kg)    General: Appears her stated age, overweight, in NAD. Skin: Warm, dry and intact. No rashes noted. HEENT: Head: normal shape and size; Eyes: sclera white, no icterus, conjunctiva pink, PERRLA and EOMs intact;  Neck:  Neck supple, trachea midline. No masses, lumps or thyromegaly present.  Cardiovascular: Normal rate and rhythm. S1,S2 noted.  No murmur, rubs or gallops noted. No JVD or BLE edema. No carotid bruits noted. Pulmonary/Chest: Normal effort and positive vesicular breath sounds. No respiratory distress. No wheezes, rales or ronchi noted.  Abdomen: Soft and nontender. Normal bowel sounds.  Pelvic: Normal female anatomy.  Cervix with nodule noted 10:00.  No discharge noted.  No CMT.  Adnexa nonpalpable. Musculoskeletal: Strength 5/5 BUE/BLE. No difficulty with gait.   Neurological: Alert and oriented. Cranial nerves II-XII grossly intact. Coordination normal.  Psychiatric: Mood and affect normal. Behavior is normal. Judgment and thought content normal.    BMET    Component Value Date/Time   NA 139 11/19/2023 0950   K 4.4 11/19/2023 0950   CL 105 11/19/2023 0950   CO2 27 11/19/2023 0950   GLUCOSE 101 (H) 11/19/2023 0950   BUN 13 11/19/2023 0950   CREATININE 0.80 11/19/2023 0950   CALCIUM  9.2 11/19/2023 0950   GFRNONAA 104 04/01/2020 0931   GFRAA 120 04/01/2020 0931    Lipid Panel     Component Value Date/Time   CHOL 169 11/19/2023 0950   TRIG 130 11/19/2023 0950   HDL 40 (L) 11/19/2023 0950   CHOLHDL 4.2  11/19/2023 0950   VLDL 19 07/17/2016 0800   LDLCALC 106 (H) 11/19/2023 0950    CBC    Component Value Date/Time   WBC 4.2 11/19/2023 0950   RBC 4.44 11/19/2023 0950   HGB 13.9 11/19/2023 0950   HCT 41.1 11/19/2023 0950   PLT 191 11/19/2023 0950   MCV 92.6 11/19/2023 0950   MCH 31.3 11/19/2023 0950   MCHC 33.8 11/19/2023 0950   RDW 12.4 11/19/2023 0950   LYMPHSABS 2,208 04/01/2020 0931   EOSABS 83 04/01/2020 0931   BASOSABS 33 04/01/2020 0931    Hgb A1C Lab Results  Component Value Date   HGBA1C 6.2 (H) 11/19/2023           Assessment & Plan:   Preventative Health Maintenance:  Encouraged her to get a flu shot in the fall Tetanus UTD per her report Encouraged her to get her COVID-vaccine She declines Prevnar 20 Discussed Shingrix vaccine, she will check coverage with her insurance company and schedule a visit if she would like to have this done Pap smear today with STD screening Mammogram ordered-she will call to schedule She reports she has a Cologuard kit at home, encouraged her to complete this within the next 2 weeks She declines CT lung cancer screening at this time Encouraged her to consume a balanced diet and exercise regimen Advised her to see an eye doctor and dentist annually We will check CBC,  c-Met, lipid, A1c today    RTC in 6 months, follow-up chronic conditions Angeline Laura, NP

## 2024-05-05 NOTE — Patient Instructions (Signed)
 Health Maintenance for Postmenopausal Women Menopause is a normal process in which your ability to get pregnant comes to an end. This process happens slowly over many months or years, usually between the ages of 76 and 38. Menopause is complete when you have missed your menstrual period for 12 months. It is important to talk with your health care provider about some of the most common conditions that affect women after menopause (postmenopausal women). These include heart disease, cancer, and bone loss (osteoporosis). Adopting a healthy lifestyle and getting preventive care can help to promote your health and wellness. The actions you take can also lower your chances of developing some of these common conditions. What are the signs and symptoms of menopause? During menopause, you may have the following symptoms: Hot flashes. These can be moderate or severe. Night sweats. Decrease in sex drive. Mood swings. Headaches. Tiredness (fatigue). Irritability. Memory problems. Problems falling asleep or staying asleep. Talk with your health care provider about treatment options for your symptoms. Do I need hormone replacement therapy? Hormone replacement therapy is effective in treating symptoms that are caused by menopause, such as hot flashes and night sweats. Hormone replacement carries certain risks, especially as you become older. If you are thinking about using estrogen or estrogen with progestin, discuss the benefits and risks with your health care provider. How can I reduce my risk for heart disease and stroke? The risk of heart disease, heart attack, and stroke increases as you age. One of the causes may be a change in the body's hormones during menopause. This can affect how your body uses dietary fats, triglycerides, and cholesterol. Heart attack and stroke are medical emergencies. There are many things that you can do to help prevent heart disease and stroke. Watch your blood pressure High  blood pressure causes heart disease and increases the risk of stroke. This is more likely to develop in people who have high blood pressure readings or are overweight. Have your blood pressure checked: Every 3-5 years if you are 32-23 years of age. Every year if you are 31 years old or older. Eat a healthy diet  Eat a diet that includes plenty of vegetables, fruits, low-fat dairy products, and lean protein. Do not eat a lot of foods that are high in solid fats, added sugars, or sodium. Get regular exercise Get regular exercise. This is one of the most important things you can do for your health. Most adults should: Try to exercise for at least 150 minutes each week. The exercise should increase your heart rate and make you sweat (moderate-intensity exercise). Try to do strengthening exercises at least twice each week. Do these in addition to the moderate-intensity exercise. Spend less time sitting. Even light physical activity can be beneficial. Other tips Work with your health care provider to achieve or maintain a healthy weight. Do not use any products that contain nicotine or tobacco. These products include cigarettes, chewing tobacco, and vaping devices, such as e-cigarettes. If you need help quitting, ask your health care provider. Know your numbers. Ask your health care provider to check your cholesterol and your blood sugar (glucose). Continue to have your blood tested as directed by your health care provider. Do I need screening for cancer? Depending on your health history and family history, you may need to have cancer screenings at different stages of your life. This may include screening for: Breast cancer. Cervical cancer. Lung cancer. Colorectal cancer. What is my risk for osteoporosis? After menopause, you may be  at increased risk for osteoporosis. Osteoporosis is a condition in which bone destruction happens more quickly than new bone creation. To help prevent osteoporosis or  the bone fractures that can happen because of osteoporosis, you may take the following actions: If you are 24-54 years old, get at least 1,000 mg of calcium and at least 600 international units (IU) of vitamin D  per day. If you are older than age 75 but younger than age 30, get at least 1,200 mg of calcium and at least 600 international units (IU) of vitamin D  per day. If you are older than age 8, get at least 1,200 mg of calcium and at least 800 international units (IU) of vitamin D  per day. Smoking and drinking excessive alcohol increase the risk of osteoporosis. Eat foods that are rich in calcium and vitamin D , and do weight-bearing exercises several times each week as directed by your health care provider. How does menopause affect my mental health? Depression may occur at any age, but it is more common as you become older. Common symptoms of depression include: Feeling depressed. Changes in sleep patterns. Changes in appetite or eating patterns. Feeling an overall lack of motivation or enjoyment of activities that you previously enjoyed. Frequent crying spells. Talk with your health care provider if you think that you are experiencing any of these symptoms. General instructions See your health care provider for regular wellness exams and vaccines. This may include: Scheduling regular health, dental, and eye exams. Getting and maintaining your vaccines. These include: Influenza vaccine. Get this vaccine each year before the flu season begins. Pneumonia vaccine. Shingles vaccine. Tetanus, diphtheria, and pertussis (Tdap) booster vaccine. Your health care provider may also recommend other immunizations. Tell your health care provider if you have ever been abused or do not feel safe at home. Summary Menopause is a normal process in which your ability to get pregnant comes to an end. This condition causes hot flashes, night sweats, decreased interest in sex, mood swings, headaches, or lack  of sleep. Treatment for this condition may include hormone replacement therapy. Take actions to keep yourself healthy, including exercising regularly, eating a healthy diet, watching your weight, and checking your blood pressure and blood sugar levels. Get screened for cancer and depression. Make sure that you are up to date with all your vaccines. This information is not intended to replace advice given to you by your health care provider. Make sure you discuss any questions you have with your health care provider. Document Revised: 01/31/2021 Document Reviewed: 01/31/2021 Elsevier Patient Education  2024 [employer].

## 2024-05-06 ENCOUNTER — Ambulatory Visit: Payer: Self-pay | Admitting: Internal Medicine

## 2024-05-06 DIAGNOSIS — E782 Mixed hyperlipidemia: Secondary | ICD-10-CM

## 2024-05-06 MED ORDER — ROSUVASTATIN CALCIUM 5 MG PO TABS: 5.0000 mg | ORAL_TABLET | Freq: Every day | ORAL | 1 refills | Status: AC

## 2024-05-09 LAB — CYTOLOGY - PAP
Chlamydia: NEGATIVE
Comment: NEGATIVE
Comment: NEGATIVE
Comment: NEGATIVE
Comment: NORMAL
Diagnosis: NEGATIVE
High risk HPV: NEGATIVE
Neisseria Gonorrhea: NEGATIVE
Trichomonas: NEGATIVE

## 2024-05-19 ENCOUNTER — Encounter: Payer: Medicaid Other | Admitting: Internal Medicine

## 2024-10-30 NOTE — Progress Notes (Unsigned)
 "  Subjective:    Patient ID: Denise Estrada, female    DOB: 09/17/1971, 54 y.o.   MRN: [id number]  HPI  Patient presents to clinic today for follow-up of chronic conditions.  Anxiety and depression (moderate, recurrent): Chronic, she is not currently taking any daily medications for this but has buspirone  if she needs it.  She is not currently seeing a therapist but has in the past but did not feel like it was helpful.  She denies SI/HI.  COPD: She denies chronic cough or shortness of breath.  She is not using any inhalers at this time.  She no longer smokes.  There are no PFTs on file.  Chronic pain: Mainly in her spine.  She takes tylenol and metaxalone  as needed with some relief of symptoms.  There is no imaging on file. She does not follow with pain management.  GERD: Triggered by acidic food. She denies breakthrough on pantoprazole . There is no upper GI on file.  HLD: Her last LDL was 133, triglycerides 157, 04/2024.  She denies myalgias on rosuvastatin .  She tries to consume a low-fat diet.  Prediabetes: Her last A1c was 6.3%, 04/2024.  She is not taking any oral diabetic medications time.  She does not check her sugars.   Review of Systems  Past Medical History:  Diagnosis Date   Anxiety 2015   COPD (chronic obstructive pulmonary disease) (HCC) 2014   mild case   Depression    GERD (gastroesophageal reflux disease)    Hyperlipidemia    Neuromuscular disorder (HCC) 2015   Ulcer of esophagus     Current Outpatient Medications  Medication Sig Dispense Refill   albuterol  (VENTOLIN  HFA) 108 (90 Base) MCG/ACT inhaler Inhale 2 puffs into the lungs every 6 (six) hours as needed for wheezing or shortness of breath. 8 g 0   busPIRone  (BUSPAR ) 5 MG tablet Take 1 tablet (5 mg total) by mouth 2 (two) times daily. (Patient taking differently: Take 5 mg by mouth as needed.) 180 tablet 1   cetirizine  (ZYRTEC ) 10 MG tablet Take 1 tablet (10 mg total) by mouth daily. 90 tablet 1    metaxalone  (SKELAXIN ) 800 MG tablet Take 1 tablet (800 mg total) by mouth 2 (two) times daily as needed for muscle spasms. 180 tablet 1   pantoprazole  (PROTONIX ) 20 MG tablet Take 1 tablet (20 mg total) by mouth daily. 90 tablet 1   rosuvastatin  (CRESTOR ) 5 MG tablet Take 1 tablet (5 mg total) by mouth daily. 90 tablet 1   No current facility-administered medications for this visit.    Allergies  Allergen Reactions   Erythromycin Base Anaphylaxis   Lansoprazole Nausea Only   Sulfamethoxazole-Trimethoprim Nausea And Vomiting   Tramadol Nausea And Vomiting    Family History  Problem Relation Age of Onset   Heart disease Mother    Stroke Mother    Diabetes Mother    Depression Mother    Dementia Mother    Arthritis Mother    Asthma Mother    Heart disease Father    Stroke Father    Diabetes Father    Lung cancer Father    Cancer Father    Heart attack Brother    Diabetes Brother    Hypertension Brother    Heart disease Brother    Hypertension Maternal Grandmother    Breast cancer Maternal Grandmother        unsure late 59's?   Asthma Maternal Grandmother    Cancer Maternal Grandmother  Stroke Maternal Grandfather    Heart attack Maternal Grandfather    Diabetes Maternal Grandfather    Diabetes Paternal Grandmother    Diabetes Son        type 1   Asthma Daughter    Asthma Daughter    Asthma Daughter    Asthma Maternal Aunt     Social History   Socioeconomic History   Marital status: Divorced    Spouse name: Not on file   Number of children: Not on file   Years of education: Not on file   Highest education level: Not on file  Occupational History   Not on file  Tobacco Use   Smoking status: Former    Types: Cigarettes    Start date: 12/16/2022    Quit date: 1983    Years since quitting: 43.1   Smokeless tobacco: Never   Tobacco comments:    3/4 pack for most of 20 years  Vaping Use   Vaping status: Every Day   Substances: Nicotine, Flavoring   Substance and Sexual Activity   Alcohol use: No   Drug use: No   Sexual activity: Yes    Birth control/protection: None  Other Topics Concern   Not on file  Social History Narrative   Not on file   Social Drivers of Health   Tobacco Use: Medium Risk (05/05/2024)   Patient History    Smoking Tobacco Use: Former    Smokeless Tobacco Use: Never    Passive Exposure: Not on [employer] Strain: Not on file  Food Insecurity: Not on file  Transportation Needs: Not on file  Physical Activity: Not on file  Stress: Not on file  Social Connections: Not on file  Intimate Partner Violence: Not on file  Depression (PHQ2-9): Low Risk (05/05/2024)   Depression (PHQ2-9)    PHQ-2 Score: 1  Alcohol Screen: Low Risk (02/22/2023)   Alcohol Screen    Last Alcohol Screening Score (AUDIT): 6  Housing: Not on file  Utilities: Not on file  Health Literacy: Not on file     Constitutional: Denies fever, malaise, fatigue, headache or abrupt weight changes.  HEENT: Denies eye pain, eye redness, ear pain, ringing in the ears, wax buildup, runny nose, nasal congestion, bloody nose, or sore throat. Respiratory: Denies difficulty breathing, shortness of breath, cough or sputum production.   Cardiovascular: Denies chest pain, chest tightness, palpitations or swelling in the hands or feet.  Gastrointestinal: Patient reports intermittent reflux.  Denies abdominal pain, bloating, constipation, diarrhea or blood in the stool.  GU: Denies urgency, frequency, pain with urination, burning sensation, blood in urine, odor or discharge. Musculoskeletal: Patient reports chronic back pain, decreased range of motion of the left index finger..  Denies decrease in range of motion, difficulty with gait, or joint swelling.  Skin: Denies redness, rashes, lesions or ulcercations.  Neurological: Denies dizziness, difficulty with memory, difficulty with speech or problems with balance and coordination.  Psych:  Patient has a history of anxiety and depression.  Denies SI/HI.  No other specific complaints in a complete review of systems (except as listed in HPI above).     Objective:   Physical Exam  LMP 03/16/2017    Wt Readings from Last 3 Encounters:  05/05/24 178 lb 12.8 oz (81.1 kg)  11/19/23 176 lb 3.2 oz (79.9 kg)  02/22/23 146 lb (66.2 kg)    General: Appears her stated age, well developed, well nourished in NAD. Skin: Warm, dry and intact.  HEENT:  Head: normal shape and size; Eyes: sclera white, no icterus, conjunctiva pink, PERRLA and EOMs intact;  Cardiovascular: Normal rate and rhythm. S1,S2 noted.  No murmur, rubs or gallops noted.  Pulmonary/Chest: Normal effort and positive vesicular breath sounds. No respiratory distress. No wheezes, rales or ronchi noted.  Abdomen: Soft and nontender. Normal bowel sounds.  Musculoskeletal: Decreased active flexion of the left index finger but normal active flexion.  No difficulty with gait.  Neurological: Alert and oriented.  Psychiatric: Mood and affect normal.  Mildly anxious appearing. Judgment and thought content normal.    BMET    Component Value Date/Time   NA 138 05/05/2024 0912   K 4.9 05/05/2024 0912   CL 103 05/05/2024 0912   CO2 27 05/05/2024 0912   GLUCOSE 105 05/05/2024 0912   BUN 18 05/05/2024 0912   CREATININE 0.88 05/05/2024 0912   CALCIUM  9.5 05/05/2024 0912   GFRNONAA 104 04/01/2020 0931   GFRAA 120 04/01/2020 0931    Lipid Panel     Component Value Date/Time   CHOL 202 (H) 05/05/2024 0912   TRIG 157 (H) 05/05/2024 0912   HDL 40 (L) 05/05/2024 0912   CHOLHDL 5.1 (H) 05/05/2024 0912   VLDL 19 07/17/2016 0800   LDLCALC 133 (H) 05/05/2024 0912    CBC    Component Value Date/Time   WBC 4.7 05/05/2024 0912   RBC 4.62 05/05/2024 0912   HGB 14.5 05/05/2024 0912   HCT 44.2 05/05/2024 0912   PLT 195 05/05/2024 0912   MCV 95.7 05/05/2024 0912   MCH 31.4 05/05/2024 0912   MCHC 32.8 05/05/2024 0912   RDW  13.2 05/05/2024 0912   LYMPHSABS 2,208 04/01/2020 0931   EOSABS 83 04/01/2020 0931   BASOSABS 33 04/01/2020 0931    Hgb A1C Lab Results  Component Value Date   HGBA1C 6.3 (H) 05/05/2024           Assessment & Plan:     RTC in 6 months for your annual exam Angeline Laura, NP  "

## 2024-10-31 ENCOUNTER — Ambulatory Visit: Admitting: Internal Medicine

## 2024-10-31 ENCOUNTER — Ambulatory Visit: Payer: Self-pay | Admitting: Internal Medicine

## 2024-10-31 ENCOUNTER — Encounter: Payer: Self-pay | Admitting: Internal Medicine

## 2024-10-31 VITALS — BP 118/82 | Ht 65.0 in | Wt 181.6 lb

## 2024-10-31 DIAGNOSIS — G8929 Other chronic pain: Secondary | ICD-10-CM

## 2024-10-31 DIAGNOSIS — J432 Centrilobular emphysema: Secondary | ICD-10-CM

## 2024-10-31 DIAGNOSIS — R350 Frequency of micturition: Secondary | ICD-10-CM

## 2024-10-31 DIAGNOSIS — E782 Mixed hyperlipidemia: Secondary | ICD-10-CM

## 2024-10-31 DIAGNOSIS — K219 Gastro-esophageal reflux disease without esophagitis: Secondary | ICD-10-CM

## 2024-10-31 DIAGNOSIS — R7303 Prediabetes: Secondary | ICD-10-CM

## 2024-10-31 DIAGNOSIS — F411 Generalized anxiety disorder: Secondary | ICD-10-CM

## 2024-10-31 DIAGNOSIS — Z1211 Encounter for screening for malignant neoplasm of colon: Secondary | ICD-10-CM

## 2024-10-31 DIAGNOSIS — R300 Dysuria: Secondary | ICD-10-CM

## 2024-10-31 DIAGNOSIS — E66811 Obesity, class 1: Secondary | ICD-10-CM

## 2024-10-31 DIAGNOSIS — F331 Major depressive disorder, recurrent, moderate: Secondary | ICD-10-CM | POA: Insufficient documentation

## 2024-10-31 LAB — CBC
HCT: 45.1 % (ref 35.9–46.0)
Hemoglobin: 15.5 g/dL (ref 11.7–15.5)
MCH: 31.4 pg (ref 27.0–33.0)
MCHC: 34.4 g/dL (ref 31.6–35.4)
MCV: 91.5 fL (ref 81.4–101.7)
MPV: 11.8 fL (ref 7.5–12.5)
Platelets: 209 10*3/uL (ref 140–400)
RBC: 4.93 Million/uL (ref 3.80–5.10)
RDW: 12.8 % (ref 11.0–15.0)
WBC: 4.7 10*3/uL (ref 3.8–10.8)

## 2024-10-31 LAB — COMPREHENSIVE METABOLIC PANEL WITH GFR
AG Ratio: 2.1 (calc) (ref 1.0–2.5)
ALT: 65 U/L — ABNORMAL HIGH (ref 6–29)
AST: 40 U/L — ABNORMAL HIGH (ref 10–35)
Albumin: 4.9 g/dL (ref 3.6–5.1)
Alkaline phosphatase (APISO): 87 U/L (ref 37–153)
BUN: 13 mg/dL (ref 7–25)
CO2: 29 mmol/L (ref 20–32)
Calcium: 9.9 mg/dL (ref 8.6–10.4)
Chloride: 103 mmol/L (ref 98–110)
Creat: 0.81 mg/dL (ref 0.50–1.03)
Globulin: 2.3 g/dL (ref 1.9–3.7)
Glucose, Bld: 98 mg/dL (ref 65–139)
Potassium: 4.7 mmol/L (ref 3.5–5.3)
Sodium: 140 mmol/L (ref 135–146)
Total Bilirubin: 0.6 mg/dL (ref 0.2–1.2)
Total Protein: 7.2 g/dL (ref 6.1–8.1)
eGFR: 86 mL/min/{1.73_m2}

## 2024-10-31 LAB — POCT URINE DIPSTICK
Glucose, UA: NEGATIVE mg/dL
Ketones, POC UA: NEGATIVE mg/dL
Nitrite, UA: NEGATIVE
Spec Grav, UA: >=1.03 — AB
Urobilinogen, UA: 0.2 U/dL
pH, UA: 6

## 2024-10-31 LAB — LIPID PANEL
Cholesterol: 153 mg/dL
HDL: 37 mg/dL — ABNORMAL LOW
LDL Cholesterol (Calc): 87 mg/dL
Non-HDL Cholesterol (Calc): 116 mg/dL
Total CHOL/HDL Ratio: 4.1 (calc)
Triglycerides: 195 mg/dL — ABNORMAL HIGH

## 2024-10-31 MED ORDER — ALBUTEROL SULFATE HFA 108 (90 BASE) MCG/ACT IN AERS: 2.0000 | INHALATION_SPRAY | Freq: Four times a day (QID) | RESPIRATORY_TRACT | 0 refills | Status: AC | PRN

## 2024-10-31 MED ORDER — PANTOPRAZOLE SODIUM 20 MG PO TBEC: 20.0000 mg | DELAYED_RELEASE_TABLET | Freq: Every day | ORAL | 1 refills | Status: AC

## 2024-10-31 MED ORDER — BUSPIRONE HCL 5 MG PO TABS: 5.0000 mg | ORAL_TABLET | Freq: Two times a day (BID) | ORAL | 1 refills | Status: AC

## 2024-10-31 MED ORDER — NITROFURANTOIN MONOHYD MACRO 100 MG PO CAPS: 100.0000 mg | ORAL_CAPSULE | Freq: Two times a day (BID) | ORAL | 0 refills | Status: AC

## 2024-10-31 MED ORDER — METAXALONE 800 MG PO TABS: 800.0000 mg | ORAL_TABLET | Freq: Two times a day (BID) | ORAL | 1 refills | Status: AC | PRN

## 2024-10-31 NOTE — Assessment & Plan Note (Addendum)
 She declines starting daily medication therapy for her anxiety or depression Continue buspirone  5 mg daily if needed Support offered

## 2024-10-31 NOTE — Assessment & Plan Note (Signed)
 Complicated by obesity C-Met and lipid profile today Encouraged her to consume a low-fat diet Continue rosuvastatin  5 mg daily

## 2024-10-31 NOTE — Patient Instructions (Signed)

## 2024-10-31 NOTE — Assessment & Plan Note (Signed)
 She declines starting daily medication therapy for her anxiety or depression Continue buspirone  5 mg daily if needed Support offered

## 2024-10-31 NOTE — Assessment & Plan Note (Signed)
 Complicated by obesity Avoid foods that trigger reflux Encourage weight loss as this can help reduce reflux symptoms Continue pantoprazole  20 mg daily

## 2024-10-31 NOTE — Assessment & Plan Note (Signed)
 Complicated by obesity Encourage low-carb diet and exercise for weight loss A1c today

## 2024-10-31 NOTE — Assessment & Plan Note (Signed)
 She declines starting daily maintenance inhaler at this time Continue albuterol  108 mcg/act1 to 2 puffs every 4-6 hours as needed

## 2024-10-31 NOTE — Assessment & Plan Note (Signed)
 Complicated by obesity Encourage weight loss as this can help reduce back pain Continue metaxalone  800 mg twice daily as needed Encourage regular stretching and core strengthening

## 2024-10-31 NOTE — Assessment & Plan Note (Signed)
 Encouraged diet and exercise for weight loss ?

## 2025-05-06 ENCOUNTER — Encounter: Admitting: Internal Medicine
